# Patient Record
Sex: Female | Born: 1990 | Race: White | Hispanic: No | Marital: Single | Smoking: Former smoker
Health system: Southern US, Community
[De-identification: ages and names within clinical notes are randomized; demographics above are authoritative.]

## PROBLEM LIST (undated history)

## (undated) ENCOUNTER — Ambulatory Visit (HOSPITAL_COMMUNITY): Admission: EM | Payer: Self-pay | Attending: Family Medicine | Admitting: Family Medicine

## (undated) ENCOUNTER — Ambulatory Visit (HOSPITAL_COMMUNITY): Admission: EM | Payer: Self-pay

## (undated) HISTORY — PX: MOUTH SURGERY: SHX715

## (undated) HISTORY — PX: NO PAST SURGERIES: SHX2092

---
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
## 2010-05-26 LAB — GC/CHLAMYDIA PROBE AMP, GENITAL
Chlamydia, DNA Probe: NEGATIVE
GC Probe Amp, Genital: NEGATIVE

## 2010-05-26 LAB — URINALYSIS, ROUTINE W REFLEX MICROSCOPIC
Bilirubin Urine: NEGATIVE
Ketones, ur: 15 mg/dL — AB
Nitrite: NEGATIVE
Specific Gravity, Urine: 1.01 (ref 1.005–1.030)
Urobilinogen, UA: 0.2 mg/dL (ref 0.0–1.0)

## 2010-05-26 LAB — POCT URINALYSIS DIP (DEVICE)
Bilirubin Urine: NEGATIVE
Hgb urine dipstick: NEGATIVE
Nitrite: NEGATIVE
Protein, ur: NEGATIVE mg/dL
Urobilinogen, UA: 0.2 mg/dL (ref 0.0–1.0)
pH: 7 (ref 5.0–8.0)

## 2010-05-26 LAB — URINE CULTURE

## 2010-05-26 LAB — CBC
HCT: 36.5 % (ref 36.0–46.0)
Hemoglobin: 12.3 g/dL (ref 12.0–15.0)
RBC: 3.89 MIL/uL (ref 3.87–5.11)
RDW: 12.4 % (ref 11.5–15.5)
WBC: 11 10*3/uL — ABNORMAL HIGH (ref 4.0–10.5)

## 2010-05-26 LAB — WET PREP, GENITAL: Clue Cells Wet Prep HPF POC: NONE SEEN

## 2010-05-26 LAB — URINE MICROSCOPIC-ADD ON

## 2014-02-03 LAB — OB RESULTS CONSOLE ABO/RH: RH TYPE: POSITIVE

## 2014-02-03 LAB — OB RESULTS CONSOLE GC/CHLAMYDIA
Chlamydia: NEGATIVE
GC PROBE AMP, GENITAL: NEGATIVE

## 2014-02-03 LAB — OB RESULTS CONSOLE ANTIBODY SCREEN: ANTIBODY SCREEN: NEGATIVE

## 2014-02-03 LAB — OB RESULTS CONSOLE HIV ANTIBODY (ROUTINE TESTING): HIV: NONREACTIVE

## 2014-02-03 LAB — OB RESULTS CONSOLE HEPATITIS B SURFACE ANTIGEN: Hepatitis B Surface Ag: NEGATIVE

## 2014-02-03 LAB — OB RESULTS CONSOLE RUBELLA ANTIBODY, IGM: Rubella: IMMUNE

## 2014-02-03 LAB — OB RESULTS CONSOLE RPR: RPR: NONREACTIVE

## 2014-02-19 NOTE — L&D Delivery Note (Signed)
Delivery Note At 7:50 PM a viable and healthy female was delivered via Vaginal, Spontaneous Delivery (Presentation: ; Occiput Anterior).  APGAR: 7,9 ; weight  P.   Placenta status: Intact, Spontaneous.  Cord: 3 vessels with the following complications: None.  Delayed cord clamping.    Anesthesia:   Episiotomy: None Lacerations: 1st degree;Labial;Vaginal Suture Repair: 3.0 vicryl rapide Est. Blood Loss (mL): 490cc  Mom to postpartum.  Baby to Couplet care / Skin to Skin.  [name], Emily Massar 08/13/2014, 8:46 PM  O+/Br/Contra?/RI/Tdap in [facility]

## 2014-07-14 LAB — OB RESULTS CONSOLE GBS: STREP GROUP B AG: POSITIVE

## 2014-08-06 ENCOUNTER — Telehealth (HOSPITAL_COMMUNITY): Payer: Self-pay | Admitting: *Deleted

## 2014-08-06 ENCOUNTER — Encounter (HOSPITAL_COMMUNITY): Payer: Self-pay | Admitting: *Deleted

## 2014-08-06 NOTE — Telephone Encounter (Signed)
Preadmission screen  

## 2014-08-13 ENCOUNTER — Inpatient Hospital Stay (HOSPITAL_COMMUNITY)
Admission: RE | Admit: 2014-08-13 | Discharge: 2014-08-15 | DRG: 775 | Disposition: A | Discharge status: Home / Self Care | Payer: Medicaid Other | Source: Ambulatory Visit | Attending: Obstetrics and Gynecology | Admitting: Obstetrics and Gynecology

## 2014-08-13 ENCOUNTER — Encounter (HOSPITAL_COMMUNITY): Payer: Self-pay

## 2014-08-13 DIAGNOSIS — Z3A39 39 weeks gestation of pregnancy: Secondary | ICD-10-CM | POA: Diagnosis present

## 2014-08-13 DIAGNOSIS — O99824 Streptococcus B carrier state complicating childbirth: Secondary | ICD-10-CM | POA: Diagnosis present

## 2014-08-13 DIAGNOSIS — Z3480 Encounter for supervision of other normal pregnancy, unspecified trimester: Secondary | ICD-10-CM

## 2014-08-13 DIAGNOSIS — Z3483 Encounter for supervision of other normal pregnancy, third trimester: Secondary | ICD-10-CM | POA: Diagnosis present

## 2014-08-13 LAB — COMPREHENSIVE METABOLIC PANEL
ALT: 12 U/L — AB (ref 14–54)
AST: 22 U/L (ref 15–41)
Albumin: 2.9 g/dL — ABNORMAL LOW (ref 3.5–5.0)
Alkaline Phosphatase: 141 U/L — ABNORMAL HIGH (ref 38–126)
Anion gap: 7 (ref 5–15)
BILIRUBIN TOTAL: 0.5 mg/dL (ref 0.3–1.2)
BUN: 7 mg/dL (ref 6–20)
CO2: 21 mmol/L — ABNORMAL LOW (ref 22–32)
Calcium: 8.6 mg/dL — ABNORMAL LOW (ref 8.9–10.3)
Chloride: 107 mmol/L (ref 101–111)
Creatinine, Ser: 0.42 mg/dL — ABNORMAL LOW (ref 0.44–1.00)
GLUCOSE: 105 mg/dL — AB (ref 65–99)
Potassium: 4 mmol/L (ref 3.5–5.1)
SODIUM: 135 mmol/L (ref 135–145)
Total Protein: 6.3 g/dL — ABNORMAL LOW (ref 6.5–8.1)

## 2014-08-13 LAB — CBC
HCT: 31.1 % — ABNORMAL LOW (ref 36.0–46.0)
Hemoglobin: 10.3 g/dL — ABNORMAL LOW (ref 12.0–15.0)
MCH: 28.1 pg (ref 26.0–34.0)
MCHC: 33.1 g/dL (ref 30.0–36.0)
MCV: 85 fL (ref 78.0–100.0)
PLATELETS: 318 10*3/uL (ref 150–400)
RBC: 3.66 MIL/uL — ABNORMAL LOW (ref 3.87–5.11)
RDW: 14.5 % (ref 11.5–15.5)
WBC: 9.5 10*3/uL (ref 4.0–10.5)

## 2014-08-13 LAB — TYPE AND SCREEN
ABO/RH(D): O POS
Antibody Screen: NEGATIVE

## 2014-08-13 LAB — ABO/RH: ABO/RH(D): O POS

## 2014-08-13 MED ORDER — PENICILLIN G POTASSIUM 5000000 UNITS IJ SOLR
2.5000 10*6.[IU] | INTRAVENOUS | Status: DC
  Administered 2014-08-13 (×2): 2.5 10*6.[IU] via INTRAVENOUS
  Filled 2014-08-13 (×5): qty 2.5

## 2014-08-13 MED ORDER — FENTANYL 2.5 MCG/ML BUPIVACAINE 1/10 % EPIDURAL INFUSION (WH - ANES): 14.0000 mL/h | INTRAMUSCULAR | Status: DC | PRN

## 2014-08-13 MED ORDER — ZOLPIDEM TARTRATE 5 MG PO TABS: 5.0000 mg | ORAL_TABLET | Freq: Every evening | ORAL | Status: DC | PRN

## 2014-08-13 MED ORDER — LACTATED RINGERS IV SOLN: INTRAVENOUS | Status: DC

## 2014-08-13 MED ORDER — PENICILLIN G POTASSIUM 5000000 UNITS IJ SOLR
5.0000 10*6.[IU] | Freq: Once | INTRAVENOUS | Status: AC
  Administered 2014-08-13: 5 10*6.[IU] via INTRAVENOUS
  Filled 2014-08-13: qty 5

## 2014-08-13 MED ORDER — ACETAMINOPHEN 325 MG PO TABS: 650.0000 mg | ORAL_TABLET | ORAL | Status: DC | PRN

## 2014-08-13 MED ORDER — OXYCODONE-ACETAMINOPHEN 5-325 MG PO TABS: 1.0000 | ORAL_TABLET | ORAL | Status: DC | PRN

## 2014-08-13 MED ORDER — LACTATED RINGERS IV SOLN: 500.0000 mL | INTRAVENOUS | Status: DC | PRN

## 2014-08-13 MED ORDER — OXYTOCIN 40 UNITS IN LACTATED RINGERS INFUSION - SIMPLE MED
1.0000 m[IU]/min | INTRAVENOUS | Status: DC
  Administered 2014-08-13: 2 m[IU]/min via INTRAVENOUS
  Filled 2014-08-13: qty 1000

## 2014-08-13 MED ORDER — BENZOCAINE-MENTHOL 20-0.5 % EX AERO
1.0000 "application " | INHALATION_SPRAY | CUTANEOUS | Status: DC | PRN
  Administered 2014-08-14 – 2014-08-15 (×2): 1 via TOPICAL
  Filled 2014-08-13 (×2): qty 56

## 2014-08-13 MED ORDER — DIBUCAINE 1 % RE OINT: 1.0000 "application " | TOPICAL_OINTMENT | RECTAL | Status: DC | PRN

## 2014-08-13 MED ORDER — ONDANSETRON HCL 4 MG PO TABS: 4.0000 mg | ORAL_TABLET | ORAL | Status: DC | PRN

## 2014-08-13 MED ORDER — SIMETHICONE 80 MG PO CHEW: 80.0000 mg | CHEWABLE_TABLET | ORAL | Status: DC | PRN

## 2014-08-13 MED ORDER — PRENATAL MULTIVITAMIN CH
1.0000 | ORAL_TABLET | Freq: Every day | ORAL | Status: DC
  Administered 2014-08-14 – 2014-08-15 (×2): 1 via ORAL
  Filled 2014-08-13 (×2): qty 1

## 2014-08-13 MED ORDER — OXYTOCIN BOLUS FROM INFUSION
500.0000 mL | INTRAVENOUS | Status: DC
Start: 2014-08-13 — End: 2014-08-13
  Administered 2014-08-13: 500 mL via INTRAVENOUS

## 2014-08-13 MED ORDER — DIPHENHYDRAMINE HCL 50 MG/ML IJ SOLN
12.5000 mg | INTRAMUSCULAR | Status: DC | PRN
Start: 2014-08-13 — End: 2014-08-13

## 2014-08-13 MED ORDER — TERBUTALINE SULFATE 1 MG/ML IJ SOLN
0.2500 mg | Freq: Once | INTRAMUSCULAR | Status: DC | PRN
  Filled 2014-08-13: qty 1

## 2014-08-13 MED ORDER — PHENYLEPHRINE 40 MCG/ML (10ML) SYRINGE FOR IV PUSH (FOR BLOOD PRESSURE SUPPORT)
80.0000 ug | PREFILLED_SYRINGE | INTRAVENOUS | Status: DC | PRN
  Filled 2014-08-13: qty 2

## 2014-08-13 MED ORDER — SENNOSIDES-DOCUSATE SODIUM 8.6-50 MG PO TABS
2.0000 | ORAL_TABLET | ORAL | Status: DC
  Administered 2014-08-14 (×2): 2 via ORAL
  Filled 2014-08-13 (×2): qty 2

## 2014-08-13 MED ORDER — OXYTOCIN 40 UNITS IN LACTATED RINGERS INFUSION - SIMPLE MED: 62.5000 mL/h | INTRAVENOUS | Status: DC

## 2014-08-13 MED ORDER — LIDOCAINE HCL (PF) 1 % IJ SOLN
30.0000 mL | INTRAMUSCULAR | Status: DC | PRN
  Administered 2014-08-13: 30 mL via SUBCUTANEOUS
  Filled 2014-08-13 (×2): qty 30

## 2014-08-13 MED ORDER — EPHEDRINE 5 MG/ML INJ
10.0000 mg | INTRAVENOUS | Status: DC | PRN
  Filled 2014-08-13: qty 2

## 2014-08-13 MED ORDER — WITCH HAZEL-GLYCERIN EX PADS: 1.0000 "application " | MEDICATED_PAD | CUTANEOUS | Status: DC | PRN

## 2014-08-13 MED ORDER — IBUPROFEN 600 MG PO TABS
600.0000 mg | ORAL_TABLET | Freq: Four times a day (QID) | ORAL | Status: DC
  Administered 2014-08-14 – 2014-08-15 (×6): 600 mg via ORAL
  Filled 2014-08-13 (×6): qty 1

## 2014-08-13 MED ORDER — BUTORPHANOL TARTRATE 1 MG/ML IJ SOLN
1.0000 mg | INTRAMUSCULAR | Status: DC | PRN
  Administered 2014-08-13 (×2): 1 mg via INTRAVENOUS
  Filled 2014-08-13 (×2): qty 1

## 2014-08-13 MED ORDER — ONDANSETRON HCL 4 MG/2ML IJ SOLN: 4.0000 mg | Freq: Four times a day (QID) | INTRAMUSCULAR | Status: DC | PRN

## 2014-08-13 MED ORDER — ONDANSETRON HCL 4 MG/2ML IJ SOLN: 4.0000 mg | INTRAMUSCULAR | Status: DC | PRN

## 2014-08-13 MED ORDER — OXYCODONE-ACETAMINOPHEN 5-325 MG PO TABS: 2.0000 | ORAL_TABLET | ORAL | Status: DC | PRN

## 2014-08-13 MED ORDER — DIPHENHYDRAMINE HCL 25 MG PO CAPS
25.0000 mg | ORAL_CAPSULE | Freq: Four times a day (QID) | ORAL | Status: DC | PRN
Start: 2014-08-13 — End: 2014-08-15

## 2014-08-13 MED ORDER — LANOLIN HYDROUS EX OINT: TOPICAL_OINTMENT | CUTANEOUS | Status: DC | PRN

## 2014-08-13 MED ORDER — CITRIC ACID-SODIUM CITRATE 334-500 MG/5ML PO SOLN: 30.0000 mL | ORAL | Status: DC | PRN

## 2014-08-13 NOTE — Progress Notes (Signed)
Patient ID: Amanda Lindsey, female   DOB: 07/06/1990, 24 y.o.   MRN: [id number]  Getting uncomfortable  AFVSS BP mildly elevated, worse with stress gen NAD FHTs 140, good var, category 1 toco q  SVE 3.4/90/0  Cont IOL

## 2014-08-13 NOTE — Progress Notes (Signed)
Patient ID: Amanda Lindsey, female   DOB: October 13, 1990, 24 y.o.   MRN: [id number]  No c/o's, +FM, no LOF, no VB, occ ctx.  D/w pt IOL  AFVSS, BP mildly elevated gen NAD FHTs 130's, good var, category 1 toco q  SVE 2/80/-1, AROM for clear fluid, w/o diff/comp  Cont IOL

## 2014-08-13 NOTE — H&P (Signed)
Amanda Lindsey is a 24 y.o. female G2P0010 at 39+ for IOL, some elevated BP.  Prenatal care largely uncomplicated.  +FM, no LOF, no VB, occ ctx. D/W pt r/b/a of IOL.  Will closely monitor BP.  Also GBBS + - will prophylax.      Maternal Medical History:  Contractions: Frequency: irregular.   Perceived severity is moderate.    Fetal activity: Perceived fetal activity is normal.    Prenatal Complications - Diabetes: none.    OB History    Gravida Para Term Preterm AB TAB SAB Ectopic Multiple Living   2    1         G1 TAB G2 present No abn pap No STD History reviewed. No pertinent past medical history. History reviewed. No pertinent past surgical history. Family History: family history includes Polycythemia in her paternal grandfather.CAD, HTN Social History:  reports that she quit smoking about 6 months ago. She has never used smokeless tobacco. Her alcohol and drug histories are not on file.  H/o MJ use.  Pharm tech, single Meds PNV, TUMS All NKDA   Prenatal Transfer Tool  Maternal Diabetes: No Genetic Screening: Declined Maternal Ultrasounds/Referrals: Normal Fetal Ultrasounds or other Referrals:  None Maternal Substance Abuse:  Yes:  Type: Other: h/o MJ Significant Maternal Medications:  None Significant Maternal Lab Results:  Lab values include: Group B Strep positive Other Comments:  None  Review of Systems  Constitutional: Negative.   HENT: Negative.   Eyes: Negative.   Respiratory: Negative.   Cardiovascular: Negative.   Gastrointestinal: Negative.   Genitourinary: Negative.   Musculoskeletal: Negative.   Skin: Negative.   Neurological: Negative.   Psychiatric/Behavioral: Negative.     Dilation: 2 Effacement (%): 70 Station: -2 Exam by:: J.Thornton, RN Blood pressure 144/88, pulse 88, temperature 97.9 F (36.6 C), temperature source Oral, resp. rate 18, height 5\' 5"  (1.651 m), weight 102.967 kg (227 lb), last menstrual period 11/08/2013. Maternal Exam:   Abdomen: Patient reports no abdominal tenderness. Fundal height is appropriate for gestation.   Estimated fetal weight is 7-8#.   Fetal presentation: vertex  Introitus: Normal vulva. Normal vagina.  Cervix: Cervix evaluated by digital exam.     Physical Exam  Constitutional: She is oriented to person, place, and time. She appears well-developed and well-nourished.  HENT:  Head: Normocephalic and atraumatic.  Cardiovascular: Normal rate and regular rhythm.   Respiratory: Effort normal and breath sounds normal. No respiratory distress. She has no wheezes.  GI: Soft. Bowel sounds are normal. She exhibits no distension. There is no tenderness.  Musculoskeletal: Normal range of motion.  Neurological: She is alert and oriented to person, place, and time.  Skin: Skin is warm and dry.  Psychiatric: She has a normal mood and affect. Her behavior is normal.    Prenatal labs: ABO, Rh: O/Positive/-- (12/16 0000) Antibody: Negative (12/16 0000) Rubella: Immune (12/16 0000) RPR: Nonreactive (12/16 0000)  HBsAg: Negative (12/16 0000)  HIV: Non-reactive (12/16 0000)  GBS: Positive (05/25 0000)   Tdap 05/19/14, Hgb 12.7 - 11.3/Plt 316K/Ur Cx neg/GC neg/ Chl neg/ CF neg/ glucola 104  [place] nl anat, ant plac, female  Assessment/Plan: 23yo G2P0 at 39+ for IOL Elevated BP - watch, treat prn GBBS + - PCN for prophylaxis Epidural prn Pitocin and AROM prn    [name], Numa Schroeter 08/13/2014, 8:11 AM

## 2014-08-13 NOTE — Progress Notes (Signed)
Provider notified of increased BPs--orders to continue to monitor until IV access is established and initiate hypertensive protocol if still needed

## 2014-08-14 LAB — CBC
HEMATOCRIT: 26.8 % — AB (ref 36.0–46.0)
Hemoglobin: 8.9 g/dL — ABNORMAL LOW (ref 12.0–15.0)
MCH: 28.4 pg (ref 26.0–34.0)
MCHC: 33.2 g/dL (ref 30.0–36.0)
MCV: 85.6 fL (ref 78.0–100.0)
Platelets: 349 10*3/uL (ref 150–400)
RBC: 3.13 MIL/uL — ABNORMAL LOW (ref 3.87–5.11)
RDW: 14.9 % (ref 11.5–15.5)
WBC: 20.9 10*3/uL — AB (ref 4.0–10.5)

## 2014-08-14 LAB — RPR: RPR: NONREACTIVE

## 2014-08-14 NOTE — Lactation Note (Signed)
This note was copied from the chart of Amanda Kamden Pooler. Lactation Consultation Note  Patient Name: Amanda Lindsey [id number]'D Date: 08/14/2014 Reason for consult: Initial assessment  Visited with Mom, baby 63 hrs old.  Mom had been given a nipple shield (16 mm) to assist with latching.  Assistance given with trying to latch baby without use of nipple shield.  Baby has small mouth, and high palate.  Mom has short nipple shafts, and compressible areola.  Baby doesn't open wide and Mom starting to latch baby onto nipple only.  Mom had been using nipple shield to push into baby's mouth to open it.  Baby able to latch onto bare breast, but does fall asleep after a few bursts of sucks and swallows.  Pre-pumped, lots of colostrum expressed.  Demonstrated proper nipple shield placement, and baby easily latched.  Encouraged Mom to try to elicit wide open mouth before latching.  Multiple swallows heard.  Encouraged skin to skin, and cue based feedings.  Touched on [facility] use with breast feeding.  Encouraged her to eat well, and drink well, and to avoid any illegal drugs while breast feeding.  Talked about smoking around a baby can increase incidence of SID.  Mom assured me that she would not be smoking anything around baby. Brochure left in room, informed Mom of IP and OP lactation services available. To follow up in am, and to call for assistance prn.  Consult Status Consult Status: Follow-up Date: 08/15/14 Follow-up type: In-patient    Amanda Lindsey 08/14/2014, 2:58 PM

## 2014-08-14 NOTE — Progress Notes (Signed)
Post Partum Day 1 Subjective: no complaints, up ad lib, voiding, tolerating PO and nl lochia, pain controlled.  Some BP elevation, will monitor  Objective: Blood pressure 145/96, pulse 100, temperature 97.5 F (36.4 C), temperature source Oral, resp. rate 18, height 5\' 5"  (1.651 m), weight 102.967 kg (227 lb), last menstrual period 11/08/2013, SpO2 99 %, unknown if currently breastfeeding.  Physical Exam:  General: alert and no distress Lochia: appropriate Uterine Fundus: firm   Recent Labs  08/13/14 0742 08/14/14 0549  HGB 10.3* 8.9*  HCT 31.1* 26.8*    Assessment/Plan: Plan for discharge tomorrow, Breastfeeding and Lactation consult.  Routine care.     LOS: 1 day   [name], Gwendlyn Hanback 08/14/2014, 8:53 AM

## 2014-08-14 NOTE — Progress Notes (Signed)
Spoke with Amanda Lindsey and informed that a Child Protective Investigator will follow up with the family in the community.  

## 2014-08-14 NOTE — Progress Notes (Signed)
CLINICAL SOCIAL WORK MATERNAL/CHILD NOTE  Patient Details  Name: Boy Lesly Bires MRN: [id number] Date of Birth: 08/13/2014  Date: 08/14/2014  Clinical Social Worker Initiating Note: Tenea Sens, LCSWDate/ Time Initiated: 08/14/14/1030   Child's Name: Georgiann Cocker Yott   Legal Guardian:  (Parents Edwena Felty and Nathaniel Man)   Need for Interpreter: None   Date of Referral:     Reason for Referral: Other (Comment)   Referral Source: [facility]   Address: [address] [place], [place] [address]  Phone number:  [phone])   Household Members: Relatives (Maternal grandmother Kamalei Ognibene)   Natural Supports (not living in the home): Extended Family, Immediate Family   Professional Supports:None   Employment: (FOB is employed)   Type of Work:     Education:     Surveyor, quantity Resources:Medicaid   Other Resources: [employer]   Cultural/Religious Considerations Which May Impact Care:none noted  Strengths:   Home is prepared for newborn, family supportive  Risk Factors/Current Problems: Substance Use    Cognitive State: Alert , Able to Concentrate    Mood/Affect: Bright , Happy    CSW Assessment: Acknowledged order for Social Work consult to assess mother's history of marijuana. MOB is a single parent with no other dependents. Parents are not married. Mother states FOB is supportive. She is currently residing with maternal grandmother. She reports occasional use of marijuana during pregnancy for nausea. She admits to a couple of puffs maybe once a week. She denies any hx of dependency. She denies any other illicit drug use or need for treatment. UDS on newborn is positive. Mother became tearful when informed of referral that will be made to DSS. Provided supportive feedback and emotional support. Mother informed of social work Surveyor, mining.   CSW Plan/Description:    Case referred to DSS. Spoke with Wes Early  and informed that he will discuss case with supervisor and decide whether to meet with patient prior to discharge to develop a safety plan.  Lenville Hibberd J, LCSW 08/14/2014, 12:34 PM

## 2014-08-15 MED ORDER — PRENATAL MULTIVITAMIN CH: 1.0000 | ORAL_TABLET | Freq: Every day | ORAL | Status: DC

## 2014-08-15 MED ORDER — IBUPROFEN 800 MG PO TABS: 800.0000 mg | ORAL_TABLET | Freq: Three times a day (TID) | ORAL | Status: DC | PRN

## 2014-08-15 NOTE — Progress Notes (Signed)
Lyberti Chenette self medicated with Ibuprofen from home x 400 mg, per patient at 0530. RN held 0600 dose of Ibuprofen 600mg  for 0600. Re educated patient on medication safety. She placed her Ibuprofen inside her purse at that time.

## 2014-08-15 NOTE — Progress Notes (Addendum)
Post Partum Day 2 Subjective: no complaints, up ad lib, voiding, tolerating PO and nl lochia, pain controlled  Objective: Blood pressure 136/86, pulse 91, temperature 98.2 F (36.8 C), temperature source Oral, resp. rate 18, height 5\' 5"  (1.651 m), weight 102.967 kg (227 lb), last menstrual period 11/08/2013, SpO2 99 %, unknown if currently breastfeeding.  Physical Exam:  General: alert and no distress Lochia: appropriate Uterine Fundus: firm   Recent Labs  08/13/14 0742 08/14/14 0549  HGB 10.3* 8.9*  HCT 31.1* 26.8*    Assessment/Plan: Discharge home, Breastfeeding and Lactation consult.  Discharge home with motrin and PNV, f/u 6 weeks.     LOS: 2 days   [name], Amanda Lindsey 08/15/2014, 8:28 AM

## 2014-08-15 NOTE — Lactation Note (Addendum)
This note was copied from the chart of Amanda Lindsey. Lactation Consultation Note Baby looks very jaundice. Mom stated baby cluster fed during the night. Mom has #16 and #20NS. Mom taught how to apply & clean nipple shield.  Hand expression to show mom colostrum.  Mom's breast concerns me d/t breast tissue to lower end of breast. Has tubular breast w/2 fingers space between breast.  Patient Name: Amanda Pauletta Risch Mom shown how to use DEBP & how to disassemble, clean, & reassemble parts. Mom knows to pump q3h for 15-20 min.Mom reports + breast changes w/pregnancy. States has been leaking for about a month.  Educated about newborn behavior, feeding habits, needing stimulation, and jaundice causes baby to be more sleepy. Mom encouraged to do skin-to-skin. Supplementing formula in NS w/curve tip syring to get baby to start BF. Baby needed lots of stimulation, then BF well. Gave supplementing feeding sheet according to age.  I feel d/t jaundice level rising and sleepy, mom should stay another night for assistance and monitoring of feedings and jaundice levels. Today's Date: 08/15/2014 Reason for consult: Follow-up assessment;Hyperbilirubinemia   Maternal Data    Feeding Feeding Type: Breast Fed  LATCH Score/Interventions Latch: Repeated attempts needed to sustain latch, nipple held in mouth throughout feeding, stimulation needed to elicit sucking reflex. Intervention(s): Skin to skin  Audible Swallowing: A few with stimulation Intervention(s): Skin to skin;Hand expression  Type of Nipple: Everted at rest and after stimulation Intervention(s): Hand pump  Comfort (Breast/Nipple): Soft / non-tender  Problem noted: Mild/Moderate discomfort Interventions (Mild/moderate discomfort): Breast shields;Post-pump  Hold (Positioning): No assistance needed to correctly position infant at breast. Intervention(s): Skin to skin;Position options;Support Pillows;Breastfeeding basics reviewed  LATCH Score:  7  Lactation Tools Discussed/Used Tools: Nipple Shields Nipple shield size: 16;20 Pump Review: Setup, frequency, and cleaning;Milk Storage Initiated by:: RN   Consult Status Consult Status: Follow-up Date: 08/15/14 Follow-up type: In-patient    Charyl Dancer 08/15/2014, 11:28 AM

## 2014-08-15 NOTE — Discharge Summary (Signed)
Obstetric Discharge Summary Reason for Admission: induction of labor Prenatal Procedures: none Intrapartum Procedures: spontaneous vaginal delivery, GBBS prophylaxis Postpartum Procedures: none Complications-Operative and Postpartum: none HEMOGLOBIN  Date Value Ref Range Status  08/14/2014 8.9* 12.0 - 15.0 g/dL Final   HCT  Date Value Ref Range Status  08/14/2014 26.8* 36.0 - 46.0 % Final    Physical Exam:  General: alert and no distress Lochia: appropriate Uterine Fundus: firm  Discharge Diagnoses: Term Pregnancy-delivered  Discharge Information: Date: 08/15/2014 Activity: pelvic rest Diet: routine Medications: PNV and Ibuprofen Condition: stable Instructions: refer to practice specific booklet Discharge to: home Follow-up Information    Follow up with [name], Aizza Santiago, MD. Schedule an appointment as soon as possible for a visit in 6 weeks.   Specialty:  Obstetrics and Gynecology   Why:  for postpartum check   Contact information:   [address] [place] [address] [phone]       Newborn Data: Live born female  Birth Weight: 6 lb 7.9 oz (2946 g) APGAR: 7, 9  Home with mother.  [name], Nareg Breighner 08/15/2014, 10:09 AM

## 2014-08-16 ENCOUNTER — Inpatient Hospital Stay (HOSPITAL_COMMUNITY): Admission: AD | Admit: 2014-08-16 | Payer: Self-pay | Source: Ambulatory Visit | Admitting: Obstetrics and Gynecology

## 2014-08-17 ENCOUNTER — Ambulatory Visit: Payer: Self-pay

## 2014-08-17 NOTE — Lactation Note (Signed)
This note was copied from the chart of Amanda Nathaniel ManCarrie Messing. Lactation Consultation Note  Patient Name: Amanda Lindsey FAOZH'YToday's Date: 08/17/2014 Reason for consult:  ([facility] )   Maternal Data    Feeding    LATCH Score/Interventions                      Lactation Tools Discussed/Used     Consult Status      Kathrin Greathouseorio, Cortney Beissel Ann 08/17/2014, 1:06 PM

## 2014-11-17 ENCOUNTER — Telehealth (HOSPITAL_COMMUNITY): Payer: Self-pay | Admitting: Emergency Medicine

## 2014-11-17 ENCOUNTER — Emergency Department (INDEPENDENT_AMBULATORY_CARE_PROVIDER_SITE_OTHER)
Admission: EM | Admit: 2014-11-17 | Discharge: 2014-11-17 | Disposition: A | Discharge status: Home / Self Care | Payer: Self-pay | Source: Home / Self Care | Attending: Family Medicine | Admitting: Family Medicine

## 2014-11-17 ENCOUNTER — Encounter (HOSPITAL_COMMUNITY): Payer: Self-pay | Admitting: *Deleted

## 2014-11-17 DIAGNOSIS — S0502XA Injury of conjunctiva and corneal abrasion without foreign body, left eye, initial encounter: Secondary | ICD-10-CM

## 2014-11-17 MED ORDER — TETRACAINE HCL 0.5 % OP SOLN
OPHTHALMIC | Status: AC
  Filled 2014-11-17: qty 2

## 2014-11-17 MED ORDER — TOBRAMYCIN 0.3 % OP OINT: 1.0000 "application " | TOPICAL_OINTMENT | Freq: Three times a day (TID) | OPHTHALMIC | Status: DC

## 2014-11-17 NOTE — Discharge Instructions (Signed)
See eye doctor for recheck.

## 2014-11-17 NOTE — ED Notes (Signed)
Accessed for patient request per pharmacy

## 2014-11-17 NOTE — ED Provider Notes (Signed)
CSN: [id number]     Arrival date & time 11/17/14  1618 History   First MD Initiated Contact with Patient 11/17/14 1748     Chief Complaint  Patient presents with  . Eye Problem   (Consider location/radiation/quality/duration/timing/severity/associated sxs/prior Treatment) Patient is a 24 y.o. female presenting with eye problem. The history is provided by the patient.  Eye Problem Location:  L eye Quality:  Burning Severity:  Mild Onset quality:  Sudden Progression:  Unchanged Chronicity:  Recurrent Context comment:  Eye injury 2mo ago , resolved but spont sx yest on awakening. no contact lenses. Relieved by:  None tried Worsened by:  Nothing tried Ineffective treatments:  None tried Associated symptoms: blurred vision, photophobia, redness and tearing     Past Medical History  Diagnosis Date  . SVD (spontaneous vaginal delivery) 08/13/2014   History reviewed. No pertinent past surgical history. Family History  Problem Relation Age of Onset  . Polycythemia Paternal Grandfather    Social History  Substance Use Topics  . Smoking status: Former Smoker    Quit date: 02/11/2014  . Smokeless tobacco: Never Used  . Alcohol Use: None   OB History    Gravida Para Term Preterm AB TAB SAB Ectopic Multiple Living   0 1     Review of Systems  Constitutional: Negative.   HENT: Negative.   Eyes: Positive for blurred vision, photophobia, pain and redness.  All other systems reviewed and are negative.   Allergies  Review of patient's allergies indicates no known allergies.  Home Medications   Prior to Admission medications   Medication Sig Start Date End Date Taking? Authorizing Provider  calcium carbonate (TUMS - DOSED IN MG ELEMENTAL CALCIUM) 500 MG chewable tablet Chew 1 tablet by mouth daily as needed for indigestion or heartburn.    Historical Provider, MD  ibuprofen (ADVIL,MOTRIN) 800 MG tablet Take 1 tablet (800 mg total) by mouth every 8 (eight) hours as  needed for moderate pain. 08/15/14   Sherian Rein, MD  Prenatal Vit-Fe Fumarate-FA (PRENATAL MULTIVITAMIN) TABS tablet Take 1 tablet by mouth daily at 12 noon. 08/15/14   Jody [name], MD  tobramycin (TOBREX) 0.3 % ophthalmic ointment Place 1 application into the left eye 3 (three) times daily. 11/17/14   Linna Hoff, MD   Meds Ordered and Administered this Visit  Medications - No data to display  BP 144/91 mmHg  Pulse 87  Temp(Src) 98 F (36.7 C) (Oral)  Resp 18  SpO2 98%  LMP 11/06/2014 No data found.   Physical Exam  Constitutional: She is oriented to person, place, and time. She appears well-developed and well-nourished. She appears distressed.  HENT:  Right Ear: External ear normal.  Left Ear: External ear normal.  Mouth/Throat: Oropharynx is clear and moist.  Eyes: EOM are normal. Pupils are equal, round, and reactive to light. Lids are everted and swept, no foreign bodies found. Left eye exhibits no discharge. Left conjunctiva is injected.    Neck: Normal range of motion. Neck supple.  Lymphadenopathy:    She has no cervical adenopathy.  Neurological: She is alert and oriented to person, place, and time.  Skin: Skin is warm and dry.  Nursing note and vitals reviewed.   ED Course  Procedures (including critical care time)  Labs Review Labs Reviewed - No data to display  Imaging Review No results found.   Visual Acuity Review  Right Eye Distance: 20/100 Left Eye Distance: 20/100  Bilateral Distance: 20/70  Right Eye Near:   Left Eye Near:    Bilateral Near:         MDM   1. Corneal abrasion, left, initial encounter        Linna Hoff, MD 11/17/14 [phone]

## 2014-11-17 NOTE — ED Notes (Signed)
Pt  Reports       l   Eye  Irritated    Since  Yesterday              Pt       Reports         Symptoms      Not  releived   By otc meds

## 2015-09-14 LAB — OB RESULTS CONSOLE GC/CHLAMYDIA
Chlamydia: NEGATIVE
GC PROBE AMP, GENITAL: NEGATIVE

## 2015-09-14 LAB — OB RESULTS CONSOLE HIV ANTIBODY (ROUTINE TESTING): HIV: NONREACTIVE

## 2015-09-14 LAB — OB RESULTS CONSOLE RPR: RPR: NONREACTIVE

## 2016-02-20 NOTE — L&D Delivery Note (Addendum)
Delivery Note Pt with bradycardia 60-90's, but rapid change. Anterior lip reduced and pt delivered with 3 contractions.   At 7:44 PM a viable and healthy female was delivered via Vaginal, Spontaneous Delivery (Presentation: OA; LOT  ).  APGAR: 8,9 ; weight P .   Placenta status: delivered, intact .  Cord: 3V  with the following complications: nuchal x 1 .    Anesthesia:  stadol Episiotomy: None Lacerations: None Suture Repair: N/A Est. Blood Loss (mL):  300cc  Mom to postpartum.  Baby to Couplet care / Skin to Skin.  [name], Naraya Stoneberg 04/01/2016, 8:01 PM  Br/RI/Tdap declined in PNC/Contra BTL when papers mature/O+  Pt declines circ

## 2016-03-14 LAB — OB RESULTS CONSOLE GBS: STREP GROUP B AG: NEGATIVE

## 2016-04-01 ENCOUNTER — Encounter (HOSPITAL_COMMUNITY): Payer: Self-pay

## 2016-04-01 ENCOUNTER — Inpatient Hospital Stay (HOSPITAL_COMMUNITY)
Admission: AD | Admit: 2016-04-01 | Discharge: 2016-04-03 | DRG: 775 | Disposition: A | Discharge status: Home / Self Care | Payer: Medicaid Other | Source: Ambulatory Visit | Attending: Obstetrics and Gynecology | Admitting: Obstetrics and Gynecology

## 2016-04-01 DIAGNOSIS — Z8249 Family history of ischemic heart disease and other diseases of the circulatory system: Secondary | ICD-10-CM | POA: Diagnosis not present

## 2016-04-01 DIAGNOSIS — Z3A38 38 weeks gestation of pregnancy: Secondary | ICD-10-CM | POA: Diagnosis not present

## 2016-04-01 DIAGNOSIS — Z87891 Personal history of nicotine dependence: Secondary | ICD-10-CM | POA: Diagnosis not present

## 2016-04-01 DIAGNOSIS — R030 Elevated blood-pressure reading, without diagnosis of hypertension: Secondary | ICD-10-CM | POA: Diagnosis present

## 2016-04-01 LAB — COMPREHENSIVE METABOLIC PANEL
ALBUMIN: 3 g/dL — AB (ref 3.5–5.0)
ALT: 13 U/L — ABNORMAL LOW (ref 14–54)
ANION GAP: 9 (ref 5–15)
AST: 16 U/L (ref 15–41)
Alkaline Phosphatase: 249 U/L — ABNORMAL HIGH (ref 38–126)
BUN: 7 mg/dL (ref 6–20)
CO2: 23 mmol/L (ref 22–32)
Calcium: 9 mg/dL (ref 8.9–10.3)
Chloride: 105 mmol/L (ref 101–111)
Creatinine, Ser: 0.31 mg/dL — ABNORMAL LOW (ref 0.44–1.00)
GFR calc Af Amer: >60 mL/min (ref >60)
GFR calc non Af Amer: >60 mL/min (ref >60)
GLUCOSE: 90 mg/dL (ref 65–99)
Potassium: 3.8 mmol/L (ref 3.5–5.1)
Sodium: 137 mmol/L (ref 135–145)
TOTAL PROTEIN: 7 g/dL (ref 6.5–8.1)
Total Bilirubin: 0.1 mg/dL — ABNORMAL LOW (ref 0.3–1.2)

## 2016-04-01 LAB — TYPE AND SCREEN
ABO/RH(D): O POS
ANTIBODY SCREEN: NEGATIVE

## 2016-04-01 LAB — CBC
HCT: 32.8 % — ABNORMAL LOW (ref 36.0–46.0)
Hemoglobin: 11.1 g/dL — ABNORMAL LOW (ref 12.0–15.0)
MCH: 28.6 pg (ref 26.0–34.0)
MCHC: 33.8 g/dL (ref 30.0–36.0)
MCV: 84.5 fL (ref 78.0–100.0)
Platelets: 374 10*3/uL (ref 150–400)
RBC: 3.88 MIL/uL (ref 3.87–5.11)
RDW: 14.6 % (ref 11.5–15.5)
WBC: 11 10*3/uL — ABNORMAL HIGH (ref 4.0–10.5)

## 2016-04-01 LAB — PROTEIN / CREATININE RATIO, URINE
Creatinine, Urine: 107 mg/dL
PROTEIN CREATININE RATIO: 0.18 mg/mg{creat} — AB (ref 0.00–0.15)
Total Protein, Urine: 19 mg/dL

## 2016-04-01 LAB — AMNISURE RUPTURE OF MEMBRANE (ROM) NOT AT ARMC: AMNISURE: NEGATIVE

## 2016-04-01 MED ORDER — DIBUCAINE 1 % RE OINT: 1.0000 "application " | TOPICAL_OINTMENT | RECTAL | Status: DC | PRN

## 2016-04-01 MED ORDER — LACTATED RINGERS IV SOLN: 500.0000 mL | INTRAVENOUS | Status: DC | PRN

## 2016-04-01 MED ORDER — FLEET ENEMA 7-19 GM/118ML RE ENEM: 1.0000 | ENEMA | RECTAL | Status: DC | PRN

## 2016-04-01 MED ORDER — COCONUT OIL OIL
1.0000 "application " | TOPICAL_OIL | Status: DC | PRN
  Administered 2016-04-02: 1 via TOPICAL
  Filled 2016-04-01: qty 120

## 2016-04-01 MED ORDER — OXYTOCIN 10 UNIT/ML IJ SOLN
INTRAMUSCULAR | Status: AC
  Administered 2016-04-01: 10 [IU] via INTRAMUSCULAR
  Filled 2016-04-01: qty 1

## 2016-04-01 MED ORDER — OXYCODONE HCL 5 MG PO TABS
5.0000 mg | ORAL_TABLET | ORAL | Status: DC | PRN
  Administered 2016-04-02 (×3): 5 mg via ORAL
  Filled 2016-04-01 (×4): qty 1

## 2016-04-01 MED ORDER — OXYTOCIN 40 UNITS IN LACTATED RINGERS INFUSION - SIMPLE MED
2.5000 [IU]/h | INTRAVENOUS | Status: DC
  Filled 2016-04-01: qty 1000

## 2016-04-01 MED ORDER — ACETAMINOPHEN 325 MG PO TABS
650.0000 mg | ORAL_TABLET | ORAL | Status: DC | PRN
  Administered 2016-04-02: 650 mg via ORAL
  Filled 2016-04-01: qty 2

## 2016-04-01 MED ORDER — OXYTOCIN 10 UNIT/ML IJ SOLN
10.0000 [IU] | Freq: Once | INTRAMUSCULAR | Status: AC
  Administered 2016-04-01: 10 [IU] via INTRAMUSCULAR

## 2016-04-01 MED ORDER — IBUPROFEN 600 MG PO TABS
600.0000 mg | ORAL_TABLET | Freq: Four times a day (QID) | ORAL | Status: DC
  Administered 2016-04-01 – 2016-04-03 (×6): 600 mg via ORAL
  Filled 2016-04-01 (×6): qty 1

## 2016-04-01 MED ORDER — ZOLPIDEM TARTRATE 5 MG PO TABS
5.0000 mg | ORAL_TABLET | Freq: Every evening | ORAL | Status: DC | PRN
Start: 2016-04-01 — End: 2016-04-03

## 2016-04-01 MED ORDER — BENZOCAINE-MENTHOL 20-0.5 % EX AERO: 1.0000 "application " | INHALATION_SPRAY | CUTANEOUS | Status: DC | PRN

## 2016-04-01 MED ORDER — ACETAMINOPHEN 325 MG PO TABS
650.0000 mg | ORAL_TABLET | ORAL | Status: DC | PRN
Start: 2016-04-01 — End: 2016-04-01

## 2016-04-01 MED ORDER — BUTORPHANOL TARTRATE 1 MG/ML IJ SOLN
1.0000 mg | INTRAMUSCULAR | Status: DC | PRN
  Administered 2016-04-01: 1 mg via INTRAVENOUS
  Filled 2016-04-01: qty 1

## 2016-04-01 MED ORDER — SIMETHICONE 80 MG PO CHEW: 80.0000 mg | CHEWABLE_TABLET | ORAL | Status: DC | PRN

## 2016-04-01 MED ORDER — DIPHENHYDRAMINE HCL 25 MG PO CAPS: 25.0000 mg | ORAL_CAPSULE | Freq: Four times a day (QID) | ORAL | Status: DC | PRN

## 2016-04-01 MED ORDER — TETANUS-DIPHTH-ACELL PERTUSSIS 5-2.5-18.5 LF-MCG/0.5 IM SUSP: 0.5000 mL | Freq: Once | INTRAMUSCULAR | Status: DC

## 2016-04-01 MED ORDER — OXYCODONE-ACETAMINOPHEN 5-325 MG PO TABS: 2.0000 | ORAL_TABLET | ORAL | Status: DC | PRN

## 2016-04-01 MED ORDER — NALOXONE HCL 0.4 MG/ML IJ SOLN
INTRAMUSCULAR | Status: AC
  Filled 2016-04-01: qty 1

## 2016-04-01 MED ORDER — OXYCODONE-ACETAMINOPHEN 5-325 MG PO TABS: 1.0000 | ORAL_TABLET | ORAL | Status: DC | PRN

## 2016-04-01 MED ORDER — WITCH HAZEL-GLYCERIN EX PADS: 1.0000 "application " | MEDICATED_PAD | CUTANEOUS | Status: DC | PRN

## 2016-04-01 MED ORDER — ONDANSETRON HCL 4 MG/2ML IJ SOLN: 4.0000 mg | Freq: Four times a day (QID) | INTRAMUSCULAR | Status: DC | PRN

## 2016-04-01 MED ORDER — ONDANSETRON HCL 4 MG/2ML IJ SOLN: 4.0000 mg | INTRAMUSCULAR | Status: DC | PRN

## 2016-04-01 MED ORDER — PRENATAL MULTIVITAMIN CH
1.0000 | ORAL_TABLET | Freq: Every day | ORAL | Status: DC
  Administered 2016-04-02: 1 via ORAL
  Filled 2016-04-01: qty 1

## 2016-04-01 MED ORDER — SOD CITRATE-CITRIC ACID 500-334 MG/5ML PO SOLN: 30.0000 mL | ORAL | Status: DC | PRN

## 2016-04-01 MED ORDER — ONDANSETRON HCL 4 MG PO TABS: 4.0000 mg | ORAL_TABLET | ORAL | Status: DC | PRN

## 2016-04-01 MED ORDER — LACTATED RINGERS IV SOLN: INTRAVENOUS | Status: DC

## 2016-04-01 MED ORDER — LIDOCAINE HCL (PF) 1 % IJ SOLN
30.0000 mL | INTRAMUSCULAR | Status: DC | PRN
  Filled 2016-04-01: qty 30

## 2016-04-01 MED ORDER — OXYTOCIN BOLUS FROM INFUSION: 500.0000 mL | Freq: Once | INTRAVENOUS | Status: DC

## 2016-04-01 MED ORDER — OXYCODONE HCL 5 MG PO TABS: 10.0000 mg | ORAL_TABLET | ORAL | Status: DC | PRN

## 2016-04-01 MED ORDER — SENNOSIDES-DOCUSATE SODIUM 8.6-50 MG PO TABS
2.0000 | ORAL_TABLET | ORAL | Status: DC
  Administered 2016-04-01: 2 via ORAL
  Filled 2016-04-01 (×2): qty 2

## 2016-04-01 NOTE — MAU Note (Signed)
Pt presents with complaint of contractions and ? Leaking fluid since 1130 am.

## 2016-04-01 NOTE — H&P (Signed)
Amanda Lindsey is a 26 y.o. female G2P1001 at 26wk with elevated BP and cervical change.  Progressed rapidly to deliver, see separate note.  Pregnancy relatively uncomplicated.  First trimester screen WNL  Pt has h/o tobacco use.  Early US showed abruption.  Pt declined Tdap and Flu.    OB History    Gravida Para Term Preterm AB Living   3 1 1   1 1    SAB TAB Ectopic Multiple Live Births         0 1    G1 SAB G2 6#7 female 6/16 SVD G3 present  No abn pap, no STD  Past Medical History:  Diagnosis Date  . SVD (spontaneous vaginal delivery) 08/13/2014  . SVD (spontaneous vaginal delivery) 04/01/2016   Past Surgical History:  Procedure Laterality Date  . NO PAST SURGERIES     Family History: family history includes Polycythemia in her paternal grandfather. CAD, HTN Social History:  reports that she quit smoking about 2 years ago. She has never used smokeless tobacco. 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   HENT: Negative.   Eyes: Negative.   Respiratory: Negative.   Cardiovascular: Negative.   Gastrointestinal: Positive for abdominal pain.  Genitourinary: Negative.   Musculoskeletal: Positive for back pain.  Skin: Negative.   Neurological: Negative.   Psychiatric/Behavioral: Negative.    Maternal Medical History:  Reason for admission: Contractions.   Contractions: Onset was 3-5 hours ago.   Frequency: regular.   Perceived severity is mild.    Fetal activity: Perceived fetal activity is normal.    Prenatal complications: no prenatal complications Prenatal Complications  - Diabetes: none.    Dilation: 10 Effacement (%): 100 Station: 0 Exam by:: dr Ellyn HackBovard Blood pressure (!) 164/85, pulse 98, temperature 97.7 F (36.5 C), temperature source Oral, resp. rate 18, height 5\' 5"  (1.651 m), weight 95.3 kg (210 lb), last menstrual period 07/09/2015, SpO2 98 %, unknown if currently breastfeeding. Maternal Exam:  Uterine Assessment: Contraction strength is moderate.  Contraction frequency is regular.   Abdomen: Patient reports no abdominal tenderness. Fundal height is approprate for gestation.   Estimated fetal weight is 7#.   Fetal presentation: vertex  Introitus: Normal vulva. Normal vagina.  Pelvis: adequate for delivery.      Physical Exam  Constitutional: She is oriented to person, place, and time. She appears well-developed and well-nourished.  HENT:  Head: Normocephalic and atraumatic.  Cardiovascular: Normal rate and regular rhythm.   Respiratory: Effort normal and breath sounds normal. No respiratory distress. She has no wheezes.  GI: Soft. Bowel sounds are normal. She exhibits no distension. There is no tenderness.  Musculoskeletal: Normal range of motion.  Neurological: She is alert and oriented to person, place, and time.  Skin: Skin is warm and dry.  Psychiatric: She has a normal mood and affect. Her behavior is normal.    Prenatal labs: ABO, Rh: --/--/O POS (02/11 1638) Antibody: NEG (02/11 1638) Rubella:  immune RPR: Nonreactive (07/26 0000)  HBsAg:  negative  HIV: Non-reactive (07/26 0000)  GBS: Negative (01/24 0000)   Hgb 13.1/Plt 363/Ur Cx neg/GC neg/Chl neg/Varicella immune  Nl NT, first trimester screen WNL glucola 116  Nl anat, post plac, female Tdap/Flu declined  Assessment/Plan: 26yo G3P1011 at 52 in labor, presented, rapidly delivered - now [id number] Desires BTL when papers mature   [name], [name] 04/01/2016, 8:11 PM

## 2016-04-01 NOTE — MAU Provider Note (Signed)
History     CSN: [id number]  Arrival date and time: 04/01/16 1518   First Provider Initiated Contact with Patient 04/01/16 1558      No chief complaint on file.  HPI Amanda Lindsey, 26 y.o. [redacted]w[redacted]d  Comes to MAU today with contractions.  Was 3 cm dilated when last checked in the office.  Has noticed a headache yesterday and today.  Contractions have been stronger today.  Reports some leaking of fluid this morning and none since then.  Denies any ankle edema and RUQ pain.  Blood pressure is slightly elevated today on arrival.  Will continue to evaluate.  OB History    Gravida Para Term Preterm AB Living   3 1 1   1 1    SAB TAB Ectopic Multiple Live Births         0 1      Past Medical History:  Diagnosis Date  . SVD (spontaneous vaginal delivery) 08/13/2014    Past Surgical History:  Procedure Laterality Date  . NO PAST SURGERIES      Family History  Problem Relation Age of Onset  . Polycythemia Paternal Grandfather     Social History  Substance Use Topics  . Smoking status: Former Smoker    Quit date: 02/11/2014  . Smokeless tobacco: Never Used  . Alcohol use No    Allergies: No Known Allergies  Prescriptions Prior to Admission  Medication Sig Dispense Refill Last Dose  . Prenatal Vit-Fe Fumarate-FA (PRENATAL MULTIVITAMIN) TABS tablet Take 1 tablet by mouth daily.   03/31/2016 at Unknown time    Review of Systems  Constitutional: Negative for fever.  Genitourinary: Negative for vaginal bleeding.       One episode of leaking of fluid Having contractions  Neurological: Positive for headaches.   Physical Exam   Blood pressure 145/82, pulse 93, temperature 98.7 F (37.1 C), temperature source Oral, resp. rate 18, height 5\' 5"  (1.651 m), weight 210 lb (95.3 kg), last menstrual period 07/09/2015, SpO2 100 %, unknown if currently breastfeeding.  Physical Exam  Nursing note and vitals reviewed. Constitutional: She is oriented to person, place, and time. She  appears well-developed and well-nourished.  HENT:  Head: Normocephalic.  Eyes: EOM are normal.  Neck: Neck supple.  GI: Soft. There is no tenderness.  No RUQ tenderness with palpation  Genitourinary:  Genitourinary Comments: Cervical exam by RN - 3 cm dilated  Musculoskeletal: Normal range of motion.  No ankle edema  Neurological: She is alert and oriented to person, place, and time.  Skin: Skin is warm and dry.  Psychiatric: She has a normal mood and affect.    MAU Course  Procedures Results for orders placed or performed during the hospital encounter of 04/01/16 (from the past 24 hour(s))  CBC     Status: Abnormal   Collection Time: 04/01/16  4:18 PM  Result Value Ref Range   WBC 11.0 (H) 4.0 - 10.5 K/uL   RBC 3.88 3.87 - 5.11 MIL/uL   Hemoglobin 11.1 (L) 12.0 - 15.0 g/dL   HCT 40.9 (L) 81.1 - 91.4 %   MCV 84.5 78.0 - 100.0 fL   MCH 28.6 26.0 - 34.0 pg   MCHC 33.8 30.0 - 36.0 g/dL   RDW 78.2 95.6 - 21.3 %   Platelets 374 150 - 400 K/uL  Comprehensive metabolic panel     Status: Abnormal   Collection Time: 04/01/16  4:18 PM  Result Value Ref Range   Sodium 137 135 -  145 mmol/L   Potassium 3.8 3.5 - 5.1 mmol/L   Chloride 105 101 - 111 mmol/L   CO2 23 22 - 32 mmol/L   Glucose, Bld 90 65 - 99 mg/dL   BUN 7 6 - 20 mg/dL   Creatinine, Ser 1.610.31 (L) 0.44 - 1.00 mg/dL   Calcium 9.0 8.9 - 09.610.3 mg/dL   Total Protein 7.0 6.5 - 8.1 g/dL   Albumin 3.0 (L) 3.5 - 5.0 g/dL   AST 16 15 - 41 U/L   ALT 13 (L) 14 - 54 U/L   Alkaline Phosphatase 249 (H) 38 - 126 U/L   Total Bilirubin 0.1 (L) 0.3 - 1.2 mg/dL   GFR calc non Af Amer >60 >60 mL/min   GFR calc Af Amer >60 >60 mL/min   Anion gap 9 5 - 15  Protein / creatinine ratio, urine     Status: Abnormal   Collection Time: 04/01/16  4:18 PM  Result Value Ref Range   Creatinine, Urine 107.00 mg/dL   Total Protein, Urine 19 mg/dL   Protein Creatinine Ratio 0.18 (H) 0.00 - 0.15 mg/mg[Cre]  Amnisure rupture of membrane (rom)not at  [facility]     Status: None   Collection Time: 04/01/16  4:18 PM  Result Value Ref Range   Amnisure ROM NEGATIVE     MDM 1600 consult with Dr. Hayes LudwigBovard-Stuckey to report client's symptoms and discuss plan of care.  Will do serial BPs and labs to evaluate for preeclampsia  1750  Report given to Dr. Hayes LudwigBovard-Stuckey by phone - reviewed labs, RN checked client and she is now 4 cm and 100% effaced - will admit for labor.  Assessment and Plan  Active labor BP fluctuates but labs do not indicate preeclampsia.  Amanda Lindsey 04/01/2016, 4:04 PM

## 2016-04-01 NOTE — Lactation Note (Signed)
This note was copied from a baby's chart. Lactation Consultation Note  Patient Name: Boy Nathaniel ManCarrie Lawhead RUEAV'WToday's Date: 04/01/2016 Reason for consult: Initial assessment Baby at 1 hr of life. Upon entry baby was coming off the breast and being weighted. Mom reports that baby was "slipping off and not really sucking" on the R breast but "did great" on the L breast. She bf her oldest child for 2wk but he was loosing wt so she started supplementing by 4 wk he was fully formula fed. She would "like to try bf longer" with this baby. Discussed baby behavior, feeding frequency, baby belly size, voids, wt loss, breast changes, and nipple care. She stated she can manually express. Given lactation handouts. Aware of OP services and support group.     Maternal Data Has patient been taught Hand Expression?: Yes Does the patient have breastfeeding experience prior to this delivery?: Yes  Feeding    LATCH Score/Interventions                      Lactation Tools Discussed/Used WIC Program: Yes   Consult Status Consult Status: Follow-up Date: 04/02/16 Follow-up type: In-patient    Rulon Eisenmengerlizabeth E Candance Bohlman 04/01/2016, 8:50 PM

## 2016-04-02 ENCOUNTER — Encounter (HOSPITAL_COMMUNITY): Payer: Self-pay

## 2016-04-02 LAB — CBC
HCT: 28.3 % — ABNORMAL LOW (ref 36.0–46.0)
Hemoglobin: 9.5 g/dL — ABNORMAL LOW (ref 12.0–15.0)
MCH: 28.8 pg (ref 26.0–34.0)
MCHC: 33.6 g/dL (ref 30.0–36.0)
MCV: 85.8 fL (ref 78.0–100.0)
PLATELETS: 335 10*3/uL (ref 150–400)
RBC: 3.3 MIL/uL — AB (ref 3.87–5.11)
RDW: 14.7 % (ref 11.5–15.5)
WBC: 13.8 10*3/uL — AB (ref 4.0–10.5)

## 2016-04-02 LAB — RPR: RPR Ser Ql: NONREACTIVE

## 2016-04-02 NOTE — Lactation Note (Signed)
This note was copied from a baby's chart. Lactation Consultation Note  Mother states she had minimal breast changes during pregnancy and had low milk supply with her 1st son. She states she was using a nipple shield and had more difficulty latching the first child. She did not pump with nipple shield with first child.  She states this child is latching much better. Baby 15 hours old.  Reviewed hand expression bilaterally.  Drops easily expressed. Attempted latching but baby too sleepy after recent feeding. Reviewed positioning. Mom encouraged to feed baby 8-12 times/24 hours and with feeding cues.  Encouraged STS.   Patient Name: Boy Nathaniel ManCarrie Ruotolo ZOXWR'UToday's Date: 04/02/2016 Reason for consult: Follow-up assessment   Maternal Data Has patient been taught Hand Expression?: Yes  Feeding Feeding Type: Breast Fed Length of feed: 15 min  LATCH Score/Interventions Latch: Grasps breast easily, tongue down, lips flanged, rhythmical sucking.  Audible Swallowing: A few with stimulation  Type of Nipple: Everted at rest and after stimulation  Comfort (Breast/Nipple): Soft / non-tender     Hold (Positioning): No assistance needed to correctly position infant at breast.  LATCH Score: 9  Lactation Tools Discussed/Used     Consult Status Consult Status: Follow-up Date: 04/03/16 Follow-up type: In-patient    Dahlia ByesBerkelhammer, [facility] 04/02/2016, 10:47 AM

## 2016-04-02 NOTE — Progress Notes (Signed)
Patient ID: Amanda Lindsey, female   DOB: September 10, 1990, 26 y.o.   MRN: [id number] PPD#1 Pt doing well. Crampy but due for pain meds now. Lochia mild, no fever, chills or headaches. Bonding well with baby- does not desire circ. Has no other complaints. Ambulating well VSS ABD- soft, FF EXT - No edema or homans  Hg- 9.5  A/P: PPD#1 s/p precip svd - stable         Routine pp care         Discharge to home tomorrow

## 2016-04-03 MED ORDER — OXYCODONE HCL 5 MG PO TABS: 5.0000 mg | ORAL_TABLET | ORAL | 0 refills | Status: DC | PRN

## 2016-04-03 MED ORDER — IBUPROFEN 600 MG PO TABS: 600.0000 mg | ORAL_TABLET | Freq: Four times a day (QID) | ORAL | 0 refills | Status: DC

## 2016-04-03 NOTE — Lactation Note (Signed)
This note was copied from a baby's chart. Lactation Consultation Note  Patient Name: Boy Nathaniel ManCarrie Mcree NATFT'DToday's Date: 04/03/2016 Reason for consult: Follow-up assessment   Follow up with mom of 38 hour old infant. Infant with 7 BF, 5 voids and 2 stools in last 24 hours. Infant weight 6 lb 3.5 oz with 4% weight loss. LATCH score 9.   Mom reports she feels BF is going well. She reports she is concerned that she cannot measure what is going in. Infant asleep on mom's chest currently. Discussed that infant with good output and weight at this time. Discussed with mom that if at any time she is worried to express with manual pump and hand expression and feed EBM to infant. Mom was told how to spoon feed. Discussed with mom that if anytime she is worried to [facility] the [facility].   Mom reports her breasts are feeling fuller as of last night and that she is able to hand express colostrum. She reports she is having some nipple tenderness with latch. She is using EBM and coconut oil to nipples and says they feel better. Mom reports she is able to hear swallows while infant at breast. Enc mom to compress.masage breast with feeding to maximize milk supply.   Reviewed I/O and enc mom to keep feeding log and take to ped appt and WIC appt. Engorgement prevention/treatment and breast milk storage and handling reviewed with mom. Manual pump given with instructions for use and cleaning. [facility] and plans to call and make an appointment after d/c. LC Brochure reviewed, reviewed OP Services, BF Support Groups and LC phone #. Enc mom to call with any questions/concerns prn.     Maternal Data Formula Feeding for Exclusion: No Has patient been taught Hand Expression?: Yes Does the patient have breastfeeding experience prior to this delivery?: Yes  Feeding Feeding Type: Breast Fed Length of feed: 15 min  LATCH Score/Interventions                     Lactation Tools  Discussed/Used WIC Program: Yes Pump Review: Setup, frequency, and cleaning;Milk Storage   Consult Status Consult Status: Complete Follow-up type: Call as needed    Ed BlalockSharon S Tram Wrenn 04/03/2016, 10:39 AM

## 2016-04-03 NOTE — Progress Notes (Signed)
Post Partum Day 2 Subjective: no complaints and tolerating PO  Objective: Blood pressure 109/60, pulse 76, temperature 98 F (36.7 C), temperature source Oral, resp. rate 18, height 5\' 5"  (1.651 m), weight 95.3 kg (210 lb), last menstrual period 07/09/2015, SpO2 99 %, unknown if currently breastfeeding.  Physical Exam:  General: alert and cooperative Lochia: appropriate Uterine Fundus: firm   Recent Labs  04/01/16 1618 04/02/16 0605  HGB 11.1* 9.5*  HCT 32.8* 28.3*    Assessment/Plan: Discharge home   LOS: 2 days   Leanna Hamid W 04/03/2016, 9:39 AM

## 2016-04-03 NOTE — Discharge Summary (Signed)
OB Discharge Summary     Patient Name: Amanda Lindsey DOB: Aug 19, 1990 MRN: [id number]  Date of admission: 04/01/2016 Delivering MD: Sherian ReinBOVARD-STUCKERT, JODY   Date of discharge: 04/03/2016  Admitting diagnosis: 38 WKS, CTXS Intrauterine pregnancy: 1951w1d     Secondary diagnosis:  Principal Problem:   SVD (spontaneous vaginal delivery) Active Problems:   Normal labor  Additional problems: none     Discharge diagnosis: Term Pregnancy Delivered                                                                                                Post partum procedures: none  Augmentation: none  Complications: None  Hospital course:  Onset of Labor With Vaginal Delivery     26 y.o. yo [id number] at 6051w1d was admitted in Active Labor on 04/01/2016. Patient had an uncomplicated labor course as follows:  Membrane Rupture Time/Date: 11:30 AM ,04/01/2016   Intrapartum Procedures: Episiotomy: None [1]                                         Lacerations:  None [1]  Patient had a delivery of a Viable infant. 04/01/2016  Information for the patient's newborn:  Sheran FavaCox, Boy Randee [[id number]][[id number]]  Delivery Method: Vaginal, Spontaneous Delivery (Filed from Delivery Summary)    Pateint had an uncomplicated postpartum course.  She is ambulating, tolerating a regular diet, passing flatus, and urinating well. Patient is discharged home in stable condition on 04/03/16.   Physical exam  Vitals:   04/02/16 0530 04/02/16 0900 04/02/16 1727 04/03/16 0551  BP: 130/71 129/89 126/81 109/60  Pulse: 85 96 82 76  Resp: 18 15 18 18   Temp: 97.9 F (36.6 C) 97.6 F (36.4 C) 98 F (36.7 C) 98 F (36.7 C)  TempSrc: Oral Oral Oral Oral  SpO2:  99%    Weight:      Height:       General: alert and cooperative Lochia: appropriate Uterine Fundus: firm  Labs: Lab Results  Component Value Date   WBC 13.8 (H) 04/02/2016   HGB 9.5 (L) 04/02/2016   HCT 28.3 (L) 04/02/2016   MCV 85.8 04/02/2016   PLT 335 04/02/2016    CMP Latest Ref Rng & Units 04/01/2016  Glucose 65 - 99 mg/dL 90  BUN 6 - 20 mg/dL 7  Creatinine 1.300.44 - 8.651.00 mg/dL 7.84(O0.31(L)  Sodium 962135 - 952145 mmol/L 137  Potassium 3.5 - 5.1 mmol/L 3.8  Chloride 101 - 111 mmol/L 105  CO2 22 - 32 mmol/L 23  Calcium 8.9 - 10.3 mg/dL 9.0  Total Protein 6.5 - 8.1 g/dL 7.0  Total Bilirubin 0.3 - 1.2 mg/dL 8.4(X0.1(L)  Alkaline Phos 38 - 126 U/L 249(H)  AST 15 - 41 U/L 16  ALT 14 - 54 U/L 13(L)    Discharge instruction: per After Visit Summary and "Baby and Me Booklet".  After visit meds:  Allergies as of 04/03/2016   No Known Allergies     Medication List  TAKE these medications   ibuprofen 600 MG tablet Commonly known as:  ADVIL,MOTRIN Take 1 tablet (600 mg total) by mouth every 6 (six) hours.   oxyCODONE 5 MG immediate release tablet Commonly known as:  Oxy IR/ROXICODONE Take 1 tablet (5 mg total) by mouth every 4 (four) hours as needed (pain scale 4-7).   prenatal multivitamin Tabs tablet Take 1 tablet by mouth daily.       Diet: routine diet  Activity: Advance as tolerated. Pelvic rest for 6 weeks.   Outpatient follow up:6 weeks Follow up Appt:No future appointments. Follow up Visit:No Follow-up on file.  Postpartum contraception: plans tubal ligation  Newborn Data: Live born female  Birth Weight: 6 lb 7.9 oz (2945 g) APGAR: 8, 9  Baby Feeding: Breast Disposition:home with mother   04/03/2016 Oliver Pila, MD

## 2016-04-27 ENCOUNTER — Encounter (HOSPITAL_COMMUNITY)
Admission: RE | Admit: 2016-04-27 | Discharge: 2016-04-27 | Disposition: A | Discharge status: Home / Self Care | Payer: Medicaid Other | Source: Ambulatory Visit | Attending: Obstetrics and Gynecology | Admitting: Obstetrics and Gynecology

## 2016-04-27 ENCOUNTER — Encounter (HOSPITAL_COMMUNITY): Payer: Self-pay

## 2016-04-27 DIAGNOSIS — Z01812 Encounter for preprocedural laboratory examination: Secondary | ICD-10-CM | POA: Insufficient documentation

## 2016-04-27 LAB — CBC
HEMATOCRIT: 34.8 % — AB (ref 36.0–46.0)
HEMOGLOBIN: 11.1 g/dL — AB (ref 12.0–15.0)
MCH: 27.1 pg (ref 26.0–34.0)
MCHC: 31.9 g/dL (ref 30.0–36.0)
MCV: 85.1 fL (ref 78.0–100.0)
PLATELETS: 353 10*3/uL (ref 150–400)
RBC: 4.09 MIL/uL (ref 3.87–5.11)
RDW: 14.1 % (ref 11.5–15.5)
WBC: 8.6 10*3/uL (ref 4.0–10.5)

## 2016-04-27 NOTE — Patient Instructions (Signed)
Your procedure is scheduled on:  Wednesday, May 09, 2016  [facility] at:  7:00 AM  Pick up the phone at the desk and dial [phone].  Call this number if you have problems the morning of surgery: [phone].  Remember: Do NOT eat food or drink after:  Midnight Tuesday, May 08, 2016  Take these medicines the morning of surgery with a SIP OF WATER:  None  Stop ALL herbal medications at this time  Do NOT smoke the day of surgery.  Do NOT wear jewelry (body piercing), metal hair clips/bobby pins, make-up, or nail polish. Do NOT wear lotions, powders, or perfumes.  You may wear deodorant. Do NOT shave for 48 hours prior to surgery. Do NOT bring valuables to the hospital. Contacts, dentures, or bridgework may not be worn into surgery.  Have a responsible adult drive you home and stay with you for 24 hours after your procedure  Bring a copy of your healthcare power of attorney and living will documents.  **Effective Friday, Jan. 12, 2018, [place] will implement no hospital visitations from children age 26 and younger due to a steady increase in flu activity in our community and hospitals. **

## 2016-05-08 NOTE — H&P (Signed)
Amanda Lindsey is an 26 y.o. female [id number] s/p SVD 04/01/16 for PPBTL by fulguration.  Pt's tubal papers were not signed 30 days prior to delivery - so PP scheduling of BTL.  D/W pt r/b/a of PPBTL including bleeding, infection and damage to surrounding organs, also increased risk of ectopic pregnancy.  D/W pt permanence of procedure.  Pt voiced understanding, questions  answered.    Pertinent Gynecological History: OB History: [id number]: G3P2012 s/p SVD 04/01/16 6#7 TAB, G1 and G2 SVD 6#7 No abn pap, last 05/2015 No STDs  Menstrual History:  No LMP recorded.    Past Medical History:  Diagnosis Date  . SVD (spontaneous vaginal delivery) 08/13/2014  . SVD (spontaneous vaginal delivery) 04/01/2016    Past Surgical History:  Procedure Laterality Date  . MOUTH SURGERY    . NO PAST SURGERIES      Family History  Problem Relation Age of Onset  . Polycythemia Paternal Grandfather   HTN, CAD  Social History:  reports that she quit smoking about 2 years ago. She has never used smokeless tobacco. She reports that she does not drink alcohol or use drugs.single, pharmacy tech  Allergies: No Known Allergies  No prescriptions prior to admission.    Review of Systems  Constitutional: Negative.   HENT: Negative.   Eyes: Negative.   Respiratory: Negative.   Cardiovascular: Negative.   Gastrointestinal: Negative.   Genitourinary: Negative.   Musculoskeletal: Negative.   Skin: Negative.   Neurological: Negative.   Psychiatric/Behavioral: Negative.     unknown if currently breastfeeding. Physical Exam  Constitutional: She is oriented to person, place, and time. She appears well-developed and well-nourished.  HENT:  Head: Normocephalic and atraumatic.  Cardiovascular: Normal rate and regular rhythm.   Respiratory: Effort normal and breath sounds normal. No respiratory distress. She has no wheezes.  GI: Soft. Bowel sounds are normal. She exhibits no distension. There is no tenderness.   Musculoskeletal: Normal range of motion.  Neurological: She is alert and oriented to person, place, and time.  Skin: Skin is warm and dry.  Psychiatric: She has a normal mood and affect. Her behavior is normal.    No results found for this or any previous visit (from the past 24 hour(s)).  No results found.  Assessment/Plan: 62ZH 25 G3P2012 s/p SVD for PPBTL D/w pt r/b/a of PPBTL - wishes to proceed  [name], Amanda Lindsey 05/08/2016, 6:34 PM

## 2016-05-09 ENCOUNTER — Ambulatory Visit (HOSPITAL_COMMUNITY): Payer: Medicaid Other | Admitting: Anesthesiology

## 2016-05-09 ENCOUNTER — Encounter (HOSPITAL_COMMUNITY): Payer: Self-pay

## 2016-05-09 ENCOUNTER — Encounter (HOSPITAL_COMMUNITY)
Admission: RE | Disposition: A | Discharge status: Home / Self Care | Payer: Self-pay | Source: Ambulatory Visit | Attending: Obstetrics and Gynecology

## 2016-05-09 ENCOUNTER — Ambulatory Visit (HOSPITAL_COMMUNITY)
Admission: RE | Admit: 2016-05-09 | Discharge: 2016-05-09 | Disposition: A | Discharge status: Home / Self Care | Payer: Medicaid Other | Source: Ambulatory Visit | Attending: Obstetrics and Gynecology | Admitting: Obstetrics and Gynecology

## 2016-05-09 DIAGNOSIS — Z302 Encounter for sterilization: Secondary | ICD-10-CM | POA: Diagnosis not present

## 2016-05-09 DIAGNOSIS — Z87891 Personal history of nicotine dependence: Secondary | ICD-10-CM | POA: Insufficient documentation

## 2016-05-09 HISTORY — PX: LAPAROSCOPIC TUBAL LIGATION: SHX1937

## 2016-05-09 LAB — PREGNANCY, URINE: PREG TEST UR: NEGATIVE

## 2016-05-09 SURGERY — LIGATION, FALLOPIAN TUBE, LAPAROSCOPIC
Anesthesia: General | Site: Abdomen | Laterality: Bilateral

## 2016-05-09 MED ORDER — GLYCOPYRROLATE 0.2 MG/ML IJ SOLN
INTRAMUSCULAR | Status: DC | PRN
  Administered 2016-05-09: 0.1 mg via INTRAVENOUS

## 2016-05-09 MED ORDER — BUPIVACAINE HCL (PF) 0.25 % IJ SOLN
INTRAMUSCULAR | Status: DC | PRN
  Administered 2016-05-09: 10 mL

## 2016-05-09 MED ORDER — KETOROLAC TROMETHAMINE 30 MG/ML IJ SOLN
INTRAMUSCULAR | Status: AC
  Filled 2016-05-09: qty 1

## 2016-05-09 MED ORDER — GLYCOPYRROLATE 0.2 MG/ML IJ SOLN
INTRAMUSCULAR | Status: AC
  Filled 2016-05-09: qty 1

## 2016-05-09 MED ORDER — OXYCODONE HCL 5 MG PO TABS
ORAL_TABLET | ORAL | Status: AC
  Filled 2016-05-09: qty 1

## 2016-05-09 MED ORDER — DEXAMETHASONE SODIUM PHOSPHATE 10 MG/ML IJ SOLN
INTRAMUSCULAR | Status: DC | PRN
  Administered 2016-05-09: 10 mg via INTRAVENOUS

## 2016-05-09 MED ORDER — METOCLOPRAMIDE HCL 5 MG/ML IJ SOLN: 10.0000 mg | Freq: Once | INTRAMUSCULAR | Status: DC | PRN

## 2016-05-09 MED ORDER — SODIUM CHLORIDE 0.9 % IJ SOLN
INTRAMUSCULAR | Status: DC | PRN
Start: 2016-05-09 — End: 2016-05-09
  Administered 2016-05-09: 10 mL

## 2016-05-09 MED ORDER — PROPOFOL 10 MG/ML IV BOLUS
INTRAVENOUS | Status: DC | PRN
  Administered 2016-05-09: 150 mg via INTRAVENOUS

## 2016-05-09 MED ORDER — MEPERIDINE HCL 25 MG/ML IJ SOLN: 6.2500 mg | INTRAMUSCULAR | Status: DC | PRN

## 2016-05-09 MED ORDER — KETOROLAC TROMETHAMINE 30 MG/ML IJ SOLN: 30.0000 mg | Freq: Once | INTRAMUSCULAR | Status: DC | PRN

## 2016-05-09 MED ORDER — LACTATED RINGERS IV SOLN
INTRAVENOUS | Status: DC
  Administered 2016-05-09: 125 mL/h via INTRAVENOUS

## 2016-05-09 MED ORDER — FENTANYL CITRATE (PF) 250 MCG/5ML IJ SOLN
INTRAMUSCULAR | Status: AC
  Filled 2016-05-09: qty 5

## 2016-05-09 MED ORDER — FENTANYL CITRATE (PF) 100 MCG/2ML IJ SOLN
INTRAMUSCULAR | Status: DC | PRN
  Administered 2016-05-09 (×5): 50 ug via INTRAVENOUS

## 2016-05-09 MED ORDER — ROCURONIUM BROMIDE 100 MG/10ML IV SOLN
INTRAVENOUS | Status: DC | PRN
  Administered 2016-05-09: 40 mg via INTRAVENOUS
  Administered 2016-05-09: 10 mg via INTRAVENOUS

## 2016-05-09 MED ORDER — LIDOCAINE HCL (CARDIAC) 20 MG/ML IV SOLN
INTRAVENOUS | Status: DC | PRN
  Administered 2016-05-09: 100 mg via INTRAVENOUS

## 2016-05-09 MED ORDER — SCOPOLAMINE 1 MG/3DAYS TD PT72
1.0000 | MEDICATED_PATCH | Freq: Once | TRANSDERMAL | Status: DC
  Administered 2016-05-09: 1.5 mg via TRANSDERMAL

## 2016-05-09 MED ORDER — SUGAMMADEX SODIUM 200 MG/2ML IV SOLN
INTRAVENOUS | Status: AC
  Filled 2016-05-09: qty 2

## 2016-05-09 MED ORDER — SUGAMMADEX SODIUM 200 MG/2ML IV SOLN
INTRAVENOUS | Status: DC | PRN
  Administered 2016-05-09: 200 mg via INTRAVENOUS

## 2016-05-09 MED ORDER — SODIUM CHLORIDE 0.9 % IJ SOLN
INTRAMUSCULAR | Status: AC
  Filled 2016-05-09: qty 10

## 2016-05-09 MED ORDER — OXYCODONE HCL 5 MG PO TABS
5.0000 mg | ORAL_TABLET | Freq: Once | ORAL | Status: AC | PRN
  Administered 2016-05-09: 5 mg via ORAL

## 2016-05-09 MED ORDER — OXYCODONE HCL 5 MG/5ML PO SOLN: 5.0000 mg | Freq: Once | ORAL | Status: AC | PRN

## 2016-05-09 MED ORDER — DEXAMETHASONE SODIUM PHOSPHATE 10 MG/ML IJ SOLN
INTRAMUSCULAR | Status: AC
  Filled 2016-05-09: qty 1

## 2016-05-09 MED ORDER — SCOPOLAMINE 1 MG/3DAYS TD PT72
MEDICATED_PATCH | TRANSDERMAL | Status: AC
  Administered 2016-05-09: 1.5 mg via TRANSDERMAL
  Filled 2016-05-09: qty 1

## 2016-05-09 MED ORDER — KETOROLAC TROMETHAMINE 30 MG/ML IJ SOLN
INTRAMUSCULAR | Status: DC | PRN
  Administered 2016-05-09: 30 mg via INTRAVENOUS

## 2016-05-09 MED ORDER — BUPIVACAINE HCL (PF) 0.25 % IJ SOLN
INTRAMUSCULAR | Status: AC
  Filled 2016-05-09: qty 30

## 2016-05-09 MED ORDER — HYDROMORPHONE HCL 1 MG/ML IJ SOLN: 0.2500 mg | INTRAMUSCULAR | Status: DC | PRN

## 2016-05-09 MED ORDER — MIDAZOLAM HCL 2 MG/2ML IJ SOLN
INTRAMUSCULAR | Status: DC | PRN
  Administered 2016-05-09: 2 mg via INTRAVENOUS

## 2016-05-09 MED ORDER — LACTATED RINGERS IV SOLN
INTRAVENOUS | Status: DC
  Administered 2016-05-09 (×2): via INTRAVENOUS

## 2016-05-09 MED ORDER — ONDANSETRON HCL 4 MG/2ML IJ SOLN
INTRAMUSCULAR | Status: AC
Start: 2016-05-09 — End: 2016-05-09
  Filled 2016-05-09: qty 2

## 2016-05-09 MED ORDER — OXYCODONE HCL 5 MG PO TABS: ORAL_TABLET | ORAL | 0 refills | Status: DC

## 2016-05-09 MED ORDER — ONDANSETRON HCL 4 MG/2ML IJ SOLN
INTRAMUSCULAR | Status: DC | PRN
  Administered 2016-05-09: 4 mg via INTRAVENOUS

## 2016-05-09 MED ORDER — MIDAZOLAM HCL 2 MG/2ML IJ SOLN
INTRAMUSCULAR | Status: AC
  Filled 2016-05-09: qty 2

## 2016-05-09 SURGICAL SUPPLY — 32 items
ADH SKN CLS APL DERMABOND .7 (GAUZE/BANDAGES/DRESSINGS) ×1
BAG SPEC RTRVL LRG 6X4 10 (ENDOMECHANICALS)
CATH ROBINSON RED A/P 16FR (CATHETERS) ×3 IMPLANT
CLOTH BEACON ORANGE TIMEOUT ST (SAFETY) ×3 IMPLANT
DECANTER SPIKE VIAL GLASS SM (MISCELLANEOUS) ×6 IMPLANT
DERMABOND ADVANCED (GAUZE/BANDAGES/DRESSINGS) ×2
DERMABOND ADVANCED .7 DNX12 (GAUZE/BANDAGES/DRESSINGS) ×1 IMPLANT
DRSG OPSITE POSTOP 3X4 (GAUZE/BANDAGES/DRESSINGS) ×2 IMPLANT
DURAPREP 26ML APPLICATOR (WOUND CARE) ×3 IMPLANT
GLOVE BIO SURGEON STRL SZ 6.5 (GLOVE) ×2 IMPLANT
GLOVE BIO SURGEONS STRL SZ 6.5 (GLOVE) ×1
GLOVE BIOGEL PI IND STRL 7.0 (GLOVE) ×1 IMPLANT
GLOVE BIOGEL PI INDICATOR 7.0 (GLOVE) ×2
GOWN STRL REUS W/TWL LRG LVL3 (GOWN DISPOSABLE) ×6 IMPLANT
NEEDLE INSUFFLATION 120MM (ENDOMECHANICALS) ×3 IMPLANT
PACK LAPAROSCOPY BASIN (CUSTOM PROCEDURE TRAY) ×3 IMPLANT
PACK TRENDGUARD 450 HYBRID PRO (MISCELLANEOUS) IMPLANT
PACK TRENDGUARD 600 HYBRD PROC (MISCELLANEOUS) IMPLANT
POUCH SPECIMEN RETRIEVAL 10MM (ENDOMECHANICALS) IMPLANT
PROTECTOR NERVE ULNAR (MISCELLANEOUS) ×6 IMPLANT
SHEARS HARMONIC ACE PLUS 36CM (ENDOMECHANICALS) ×1 IMPLANT
SLEEVE XCEL OPT CAN 5 100 (ENDOMECHANICALS) ×3 IMPLANT
SUT VIC AB 3-0 CTX 36 (SUTURE) IMPLANT
SUT VIC AB 3-0 PS2 18 (SUTURE) ×3
SUT VIC AB 3-0 PS2 18XBRD (SUTURE) ×1 IMPLANT
SUT VICRYL 0 UR6 27IN ABS (SUTURE) ×2 IMPLANT
TOWEL OR 17X24 6PK STRL BLUE (TOWEL DISPOSABLE) ×6 IMPLANT
TRENDGUARD 450 HYBRID PRO PACK (MISCELLANEOUS) ×3
TRENDGUARD 600 HYBRID PROC PK (MISCELLANEOUS)
TROCAR XCEL NON-BLD 11X100MML (ENDOMECHANICALS) ×3 IMPLANT
WARMER LAPAROSCOPE (MISCELLANEOUS) ×3 IMPLANT
WATER STERILE IRR 1000ML POUR (IV SOLUTION) ×3 IMPLANT

## 2016-05-09 NOTE — Anesthesia Preprocedure Evaluation (Signed)
Anesthesia Evaluation  Patient identified by MRN, date of birth, ID band Patient awake    Reviewed: Allergy & Precautions, NPO status , Patient's Chart, lab work & pertinent test results  Airway Mallampati: II  TM Distance: >3 FB Neck ROM: Full    Dental no notable dental hx.    Pulmonary neg pulmonary ROS, former smoker,    Pulmonary exam normal breath sounds clear to auscultation       Cardiovascular negative cardio ROS Normal cardiovascular exam Rhythm:Regular Rate:Normal     Neuro/Psych negative neurological ROS  negative psych ROS   GI/Hepatic negative GI ROS, Neg liver ROS,   Endo/Other  negative endocrine ROS  Renal/GU negative Renal ROS  negative genitourinary   Musculoskeletal negative musculoskeletal ROS (+)   Abdominal (+) + obese,   Peds negative pediatric ROS (+)  Hematology negative hematology ROS (+)   Anesthesia Other Findings   Reproductive/Obstetrics negative OB ROS                             Anesthesia Physical Anesthesia Plan  ASA: II  Anesthesia Plan: General   Post-op Pain Management:    Induction: Intravenous  Airway Management Planned: Oral ETT  Additional Equipment:   Intra-op Plan:   Post-operative Plan: Extubation in OR  Informed Consent: I have reviewed the patients History and Physical, chart, labs and discussed the procedure including the risks, benefits and alternatives for the proposed anesthesia with the patient or authorized representative who has indicated his/her understanding and acceptance.   Dental advisory given  Plan Discussed with: CRNA  Anesthesia Plan Comments:         Anesthesia Quick Evaluation

## 2016-05-09 NOTE — Anesthesia Postprocedure Evaluation (Signed)
Anesthesia Post Note  Patient: Amanda Lindsey  Procedure(s) Performed: Procedure(s) (LRB): LAPAROSCOPIC TUBAL LIGATION (Bilateral)  Patient location during evaluation: PACU Anesthesia Type: General Level of consciousness: awake and alert Pain management: pain level controlled Vital Signs Assessment: post-procedure vital signs reviewed and stable Respiratory status: spontaneous breathing, nonlabored ventilation and respiratory function stable Cardiovascular status: blood pressure returned to baseline and stable Postop Assessment: no signs of nausea or vomiting Anesthetic complications: no        Last Vitals:  Vitals:   05/09/16 1000 05/09/16 1015  BP: 124/76 133/79  Pulse: 72 81  Resp: 15 16  Temp:  36.8 C    Last Pain:  Vitals:   05/09/16 1015  TempSrc: Oral   Pain Goal: Patients Stated Pain Goal: 3 (05/09/16 0713)               Lowella CurbWarren Ray Shenika Quint

## 2016-05-09 NOTE — Brief Op Note (Signed)
05/09/2016  9:34 AM  PATIENT:  Amanda Lindsey  26 y.o. female  PRE-OPERATIVE DIAGNOSIS:  Desires sterilization  POST-OPERATIVE DIAGNOSIS:  Desires sterilization  PROCEDURE:  Procedure(s): LAPAROSCOPIC TUBAL LIGATION (Bilateral) by fulguration  SURGEON:  Surgeon(s) and Role:    * Sherian ReinJody [name], MD - Primary  ANESTHESIA:   general  EBL:  Total I/O In: 1300 [I.V.:1300] Out: 220 [Urine:200; Blood:20]  BLOOD ADMINISTERED:none  DRAINS: none   LOCAL MEDICATIONS USED:  MARCAINE     SPECIMEN:  No Specimen  DISPOSITION OF SPECIMEN:  N/A  COUNTS:  YES  TOURNIQUET:  * No tourniquets in log *  DICTATION: .Other Dictation: Dictation Number [id number]  PLAN OF CARE: Discharge to home after PACU  PATIENT DISPOSITION:  PACU - hemodynamically stable.   Delay start of Pharmacological VTE agent (>24hrs) due to surgical blood loss or risk of bleeding: not applicable

## 2016-05-09 NOTE — Anesthesia Procedure Notes (Signed)
Procedure Name: Intubation Date/Time: 05/09/2016 8:33 AM Performed by: Georgeanne Nim Pre-anesthesia Checklist: Patient identified, Emergency Drugs available, Suction available, Patient being monitored and Timeout performed Patient Re-evaluated:Patient Re-evaluated prior to inductionOxygen Delivery Method: Circle system utilized Preoxygenation: Pre-oxygenation with 100% oxygen Intubation Type: IV induction Ventilation: Mask ventilation without difficulty Laryngoscope Size: Mac and 3 Grade View: Grade I Tube type: Oral Tube size: 7.0 mm Number of attempts: 1 Airway Equipment and Method: Stylet Secured at: 21 cm Tube secured with: Tape Dental Injury: Teeth and Oropharynx as per pre-operative assessment

## 2016-05-09 NOTE — Interval H&P Note (Signed)
History and Physical Interval Note:  05/09/2016 8:12 AM  Amanda Lindsey  has presented today for surgery, with the diagnosis of sterilization  The various methods of treatment have been discussed with the patient and family. After consideration of risks, benefits and other options for treatment, the patient has consented to  Procedure(s): LAPAROSCOPIC TUBAL LIGATION (Bilateral) as a surgical intervention .  The patient's history has been reviewed, patient examined, no change in status, stable for surgery.  I have reviewed the patient's chart and labs.  Questions were answered to the patient's satisfaction.     [name], Joscelynn Brutus

## 2016-05-09 NOTE — Transfer of Care (Signed)
Immediate Anesthesia Transfer of Care Note  Patient: Amanda GeninCarrie M Freer  Procedure(s) Performed: Procedure(s): LAPAROSCOPIC TUBAL LIGATION (Bilateral)  Patient Location: PACU  Anesthesia Type:General  Level of Consciousness: awake, alert , oriented and patient cooperative  Airway & Oxygen Therapy: Patient Spontanous Breathing and Patient connected to nasal cannula oxygen  Post-op Assessment: Report given to RN and Post -op Vital signs reviewed and stable  Post vital signs: Reviewed and stable  Last Vitals:  Vitals:   05/09/16 0713  BP: 124/79  Pulse: 75  Resp: 16  Temp: 36.7 C    Last Pain:  Vitals:   05/09/16 0713  TempSrc: Oral      Patients Stated Pain Goal: 3 (05/09/16 0713)  Complications: No apparent anesthesia complications

## 2016-05-09 NOTE — Op Note (Signed)
NAME:  ,                                 ACCOUNT NO.:  MEDICAL RECORD NO.:  [id number]  LOCATION:                                 FACILITY:  PHYSICIAN:  Sherron MondayJody Bovard, MD             DATE OF BIRTH:  DATE OF PROCEDURE:  05/09/2016 DATE OF DISCHARGE:                              OPERATIVE REPORT   PREOPERATIVE DIAGNOSIS:  Status post spontaneous vaginal delivery on February 18 with immature Medicaid tubal papers, desires sterilization.  POSTOPERATIVE DIAGNOSIS:  Status post spontaneous vaginal delivery on February 18 with immature Medicaid tubal papers, desires sterilization, status post laparoscopic tubal ligation via fulguration.  PROCEDURE:  Laparoscopic tubal ligation via fulguration.  ANESTHESIA:  General.  IV FLUIDS:  1300.  URINE OUTPUT:  200 mL.  ESTIMATED BLOOD LOSS:  Approximately 20 mL.  PATHOLOGY:  None.  COMPLICATIONS:  None.  DESCRIPTION OF PROCEDURE:  After informed consent was reviewed with the patient and she was transferred to the operating room, placed on the table in supine position, placed in the [place] stirrups, double checking that she is comfortable.  General anesthesia was induced and found to be adequate.  She was then prepped and draped in the normal sterile fashion.  Using an open-sided speculum, a Hulka manipulator was placed in her uterus.  Her bladder was sterilely drained.  Gloves and gown were changed.  Attention was turned to the abdominal portion of the case and approximately 15 mm infraumbilical incision was made.  The fascia was cleared off with hemostats.  Using the Veress needle, the peritoneum was entered after confirmation with the hanging drop test. Pneumoperitoneum was obtained.  The trocar was placed with direct visualization.  A brief pelvic survey revealed normal postpartum uterus, normal tubes and ovaries bilaterally.  Normal liver edge and gallbladder.  The tubes were followed out to the fimbriated end bilaterally.  Using a  triple burn technique, an approximately 3 cm portion of the tubes was fulgurated using the Kleppinger device. Hemostasis was assured.  The gas was evacuated from the abdomen.  The trocar was removed after the gas was evacuated.  The suture of 0 Vicryl was used to reapproximate the fascia.  The skin was reapproximated using subcu stitch with 3-0 Vicryl and Dermabond was applied.  The patient tolerated the procedure well.  The Hulka manipulator was removed.  Sponge, lap, and needle counts were correct x2 per the operating staff.     Sherron MondayJody Bovard, MD     JB/MEDQ  D:  05/09/2016  T:  05/09/2016  Job:  [id number]

## 2016-05-09 NOTE — Discharge Instructions (Signed)

## 2016-05-11 ENCOUNTER — Encounter (HOSPITAL_COMMUNITY): Payer: Self-pay | Admitting: Obstetrics and Gynecology

## 2016-07-10 ENCOUNTER — Encounter (HOSPITAL_BASED_OUTPATIENT_CLINIC_OR_DEPARTMENT_OTHER): Payer: Self-pay | Admitting: Emergency Medicine

## 2016-07-10 ENCOUNTER — Emergency Department (HOSPITAL_BASED_OUTPATIENT_CLINIC_OR_DEPARTMENT_OTHER)
Admission: EM | Admit: 2016-07-10 | Discharge: 2016-07-10 | Disposition: A | Discharge status: Home / Self Care | Payer: Medicaid Other | Attending: Emergency Medicine | Admitting: Emergency Medicine

## 2016-07-10 DIAGNOSIS — Z87891 Personal history of nicotine dependence: Secondary | ICD-10-CM | POA: Insufficient documentation

## 2016-07-10 DIAGNOSIS — J0100 Acute maxillary sinusitis, unspecified: Secondary | ICD-10-CM

## 2016-07-10 MED ORDER — FLUTICASONE PROPIONATE 50 MCG/ACT NA SUSP: 2.0000 | Freq: Every day | NASAL | 0 refills | Status: DC

## 2016-07-10 MED ORDER — PSEUDOEPHEDRINE HCL 60 MG PO TABS: 60.0000 mg | ORAL_TABLET | ORAL | 0 refills | Status: DC | PRN

## 2016-07-10 MED ORDER — AMOXICILLIN-POT CLAVULANATE 875-125 MG PO TABS: 1.0000 | ORAL_TABLET | Freq: Two times a day (BID) | ORAL | 0 refills | Status: DC

## 2016-07-10 NOTE — ED Triage Notes (Signed)
Sinus pressure and congestion x 1 week.

## 2016-07-10 NOTE — Discharge Instructions (Signed)
Please read and follow all provided instructions.  Your diagnoses today include:  1. Acute non-recurrent maxillary sinusitis    Tests performed today include:  Vital signs. See below for your results today.   Medications prescribed:   Pseudoephedrine - decongestant medication to help with nasal congestion   Augmentin - antibiotic  You have been prescribed an antibiotic medicine: take the entire course of medicine even if you are feeling better. Stopping early can cause the antibiotic not to work.  Only fill this antibiotic if you worsen or continue to have symptoms in 72 hours. If filled, take entire course of antibiotics as directed and follow-up with your doctor.  Take any prescribed medications only as directed.  Home care instructions:  Follow any educational materials contained in this packet.  BE VERY CAREFUL not to take multiple medicines containing Tylenol (also called acetaminophen). Doing so can lead to an overdose which can damage your liver and cause liver failure and possibly death.   Follow-up instructions: Please follow-up with your primary care provider in the next 3 days for further evaluation of your symptoms.   Return instructions:   Please return to the Emergency Department if you experience worsening symptoms.   Please return if you have any other emergent concerns.  Additional Information:  Your vital signs today were: BP 124/80 (BP Location: Right Arm)    Pulse 92    Temp 98.5 F (36.9 C) (Oral)    Resp 12    Ht 5\' 5"  (1.651 m)    Wt 86.2 kg (190 lb)    LMP 06/13/2016    SpO2 100%    BMI 31.62 kg/m  If your blood pressure (BP) was elevated above 135/85 this visit, please have this repeated by your doctor within one month. --------------

## 2016-07-10 NOTE — ED Provider Notes (Signed)
[facility]   CSN: [id number] Arrival date & time: 07/10/16  1314     History   Chief Complaint Chief Complaint  Patient presents with  . Facial Pain    HPI Amanda Lindsey is a 26 y.o. female.  Patient presents with complaint of nasal congestion, facial pain, headache, runny nose and sneezing for the past 1 week. Patient notes that several coworkers have been sick. She denies any documented fevers, ear pain, sore throat, cough, chest pain. No vomiting or diarrhea. Patient has used Tylenol with improvement in her pain. No other over-the-counter medications used. Denies a strong history of seasonal allergies. The onset of this condition was acute. The course is constant. Aggravating factors: none.       Past Medical History:  Diagnosis Date  . SVD (spontaneous vaginal delivery) 08/13/2014  . SVD (spontaneous vaginal delivery) 04/01/2016    Patient Active Problem List   Diagnosis Date Noted  . Normal labor 04/01/2016  . SVD (spontaneous vaginal delivery) 04/01/2016    Past Surgical History:  Procedure Laterality Date  . LAPAROSCOPIC TUBAL LIGATION Bilateral 05/09/2016   Procedure: LAPAROSCOPIC TUBAL LIGATION;  Surgeon: Sherian Rein, MD;  Location: [facility];  Service: Gynecology;  Laterality: Bilateral;  . MOUTH SURGERY    . NO PAST SURGERIES      OB History    Gravida Para Term Preterm AB Living   3 2 2   1 2    SAB TAB Ectopic Multiple Live Births         0 2       Home Medications    Prior to Admission medications   Medication Sig Start Date End Date Taking? Authorizing Provider  amoxicillin-clavulanate (AUGMENTIN) 875-125 MG tablet Take 1 tablet by mouth every 12 (twelve) hours. 07/10/16   Renne Crigler, PA-C  fluticasone (FLONASE) 50 MCG/ACT nasal spray Place 2 sprays into both nostrils daily. 07/10/16   Renne Crigler, PA-C  oxyCODONE (OXY IR/ROXICODONE) 5 MG immediate release tablet 1-2 po q 6 hr prn severe pain 05/09/16    [name], Jody, MD  Prenatal Vit-Fe Fumarate-FA (PRENATAL MULTIVITAMIN) TABS tablet Take 1 tablet by mouth daily.    [provider]  pseudoephedrine (SUDAFED) 60 MG tablet Take 1 tablet (60 mg total) by mouth every 4 (four) hours as needed for congestion. 07/10/16   Renne Crigler, PA-C    Family History Family History  Problem Relation Age of Onset  . Polycythemia Paternal Grandfather     Social History Social History  Substance Use Topics  . Smoking status: Former Smoker    Quit date: 02/11/2014  . Smokeless tobacco: Never Used  . Alcohol use No     Allergies   Patient has no known allergies.   Review of Systems Review of Systems  Constitutional: Negative for chills, fatigue and fever.  HENT: Positive for congestion, rhinorrhea, sinus pain, sinus pressure and sneezing. Negative for ear pain and sore throat.   Eyes: Negative for discharge and redness.  Respiratory: Negative for cough and wheezing.   Gastrointestinal: Negative for abdominal pain, diarrhea, nausea and vomiting.  Genitourinary: Negative for dysuria.  Musculoskeletal: Negative for myalgias and neck stiffness.  Skin: Negative for rash.  Neurological: Positive for headaches.  Hematological: Negative for adenopathy.     Physical Exam Updated Vital Signs BP 124/80 (BP Location: Right Arm)   Pulse 92   Temp 98.5 F (36.9 C) (Oral)   Resp 12   Ht 5\' 5"  (1.651 m)  Wt 86.2 kg (190 lb)   LMP 06/13/2016   SpO2 100%   BMI 31.62 kg/m   Physical Exam  Constitutional: She appears well-developed and well-nourished.  HENT:  Head: Normocephalic and atraumatic.  Right Ear: Tympanic membrane, external ear and ear canal normal.  Left Ear: Tympanic membrane, external ear and ear canal normal.  Nose: Mucosal edema present. No rhinorrhea. Right sinus exhibits maxillary sinus tenderness and frontal sinus tenderness. Left sinus exhibits maxillary sinus tenderness. Left sinus exhibits no frontal sinus  tenderness.  Mouth/Throat: Uvula is midline, oropharynx is clear and moist and mucous membranes are normal. Mucous membranes are not dry. No oral lesions. No trismus in the jaw. No uvula swelling. No oropharyngeal exudate, posterior oropharyngeal edema, posterior oropharyngeal erythema or tonsillar abscesses.  Eyes: Conjunctivae are normal. Right eye exhibits no discharge. Left eye exhibits no discharge.  Neck: Normal range of motion. Neck supple.  Cardiovascular: Normal rate, regular rhythm and normal heart sounds.   Pulmonary/Chest: Effort normal and breath sounds normal. No respiratory distress. She has no wheezes. She has no rales.  Abdominal: Soft. There is no tenderness.  Lymphadenopathy:    She has no cervical adenopathy.  Neurological: She is alert.  Skin: Skin is warm and dry.  Psychiatric: She has a normal mood and affect.  Nursing note and vitals reviewed.    ED Treatments / Results   Procedures Procedures (including critical care time)  Medications Ordered in ED Medications - No data to display   Initial Impression / Assessment and Plan / ED Course  I have reviewed the triage vital signs and the nursing notes.  Pertinent labs & imaging results that were available during my care of the patient were reviewed by me and considered in my medical decision making (see chart for details).     Patient seen and examined.   Vital signs reviewed and are as follows: BP 124/80 (BP Location: Right Arm)   Pulse 92   Temp 98.5 F (36.9 C) (Oral)   Resp 12   Ht 5\' 5"  (1.651 m)   Wt 86.2 kg (190 lb)   LMP 06/13/2016   SpO2 100%   BMI 31.62 kg/m   Will treat patient symptomatically with Sudafed and Flonase. If patient is not improved in 3 days, she was given a prescription for Augmentin to fill.  Encourage return with high fever, vomiting, severe headache, new symptoms or other concerns.  Final Clinical Impressions(s) / ED Diagnoses   Final diagnoses:  Acute  non-recurrent maxillary sinusitis   Patient with clinical sinusitis for 7 days. She appears well, nontoxic. Will continue symptomatic treatment as she has not maximized previous therapy. Antibiotic if not improved in 72 hours.  New Prescriptions New Prescriptions   AMOXICILLIN-CLAVULANATE (AUGMENTIN) 875-125 MG TABLET    Take 1 tablet by mouth every 12 (twelve) hours.   FLUTICASONE (FLONASE) 50 MCG/ACT NASAL SPRAY    Place 2 sprays into both nostrils daily.   PSEUDOEPHEDRINE (SUDAFED) 60 MG TABLET    Take 1 tablet (60 mg total) by mouth every 4 (four) hours as needed for congestion.     Renne CriglerGeiple, Minervia Osso, PA-C 07/10/16 1616    Charlynne PanderYao, David Hsienta, MD 07/11/16 [phone]

## 2017-08-01 ENCOUNTER — Encounter (HOSPITAL_COMMUNITY): Payer: Self-pay

## 2017-08-01 ENCOUNTER — Ambulatory Visit (HOSPITAL_COMMUNITY)
Admission: EM | Admit: 2017-08-01 | Discharge: 2017-08-01 | Disposition: A | Discharge status: Home / Self Care | Payer: Medicaid Other | Attending: Family Medicine | Admitting: Family Medicine

## 2017-08-01 DIAGNOSIS — H9203 Otalgia, bilateral: Secondary | ICD-10-CM

## 2017-08-01 DIAGNOSIS — J069 Acute upper respiratory infection, unspecified: Secondary | ICD-10-CM | POA: Diagnosis not present

## 2017-08-01 MED ORDER — IBUPROFEN 600 MG PO TABS: 600.0000 mg | ORAL_TABLET | Freq: Four times a day (QID) | ORAL | 0 refills | Status: AC | PRN

## 2017-08-01 MED ORDER — TRIAMCINOLONE ACETONIDE 55 MCG/ACT NA AERO: 2.0000 | INHALATION_SPRAY | Freq: Every day | NASAL | 12 refills | Status: DC

## 2017-08-01 NOTE — ED Triage Notes (Signed)
Pt presents with sore throat and bilateral earaches.

## 2017-08-01 NOTE — Discharge Instructions (Signed)
Push fluids to ensure adequate hydration and keep secretions thin.  Tylenol and/or ibuprofen as needed for pain or fevers.  Daily nasal spray should help with your ear pain. Rest. If symptoms worsen or do not improve in the next week to return to be seen or to follow up with your PCP.

## 2017-08-01 NOTE — ED Provider Notes (Signed)
[facility]    CSN: [id number] Arrival date & time: 08/01/17  1847     History   Chief Complaint Chief Complaint  Patient presents with  . Sore Throat  . Otalgia    HPI TREZURE CRONK is a 27 y.o. female.   Shondell presents with complaints of sore throat and bilateral ear pain. Sore throat started two days ago, followed by left ear pain. Right ear started today. Has been taking benadryl which has helped some. No fevers. No cough or congestion. Mild nausea but has been eating and drinking. No known ill contacts. Has been taking tylenol and ibuprofen for pain control, ibuprofen last 3 hours ago, has been helping. Without contributing medical history.     ROS per HPI.      Past Medical History:  Diagnosis Date  . SVD (spontaneous vaginal delivery) 08/13/2014  . SVD (spontaneous vaginal delivery) 04/01/2016    Patient Active Problem List   Diagnosis Date Noted  . Normal labor 04/01/2016  . SVD (spontaneous vaginal delivery) 04/01/2016    Past Surgical History:  Procedure Laterality Date  . LAPAROSCOPIC TUBAL LIGATION Bilateral 05/09/2016   Procedure: LAPAROSCOPIC TUBAL LIGATION;  Surgeon: Sherian Rein, MD;  Location: [facility];  Service: Gynecology;  Laterality: Bilateral;  . MOUTH SURGERY    . NO PAST SURGERIES      OB History    Gravida  3   Para  2   Term  2   Preterm      AB  1   Living  2     SAB      TAB      Ectopic      Multiple  0   Live Births  2            Home Medications    Prior to Admission medications   Medication Sig Start Date End Date Taking? Authorizing Provider  amoxicillin-clavulanate (AUGMENTIN) 875-125 MG tablet Take 1 tablet by mouth every 12 (twelve) hours. 07/10/16   Renne Crigler, PA-C  fluticasone (FLONASE) 50 MCG/ACT nasal spray Place 2 sprays into both nostrils daily. 07/10/16   Renne Crigler, PA-C  ibuprofen (ADVIL,MOTRIN) 600 MG tablet Take 1 tablet (600 mg total) by mouth every 6 (six) hours  as needed. 08/01/17   Linus Mako B, NP  oxyCODONE (OXY IR/ROXICODONE) 5 MG immediate release tablet 1-2 po q 6 hr prn severe pain 05/09/16   [name], Jody, MD  Prenatal Vit-Fe Fumarate-FA (PRENATAL MULTIVITAMIN) TABS tablet Take 1 tablet by mouth daily.    [provider]  pseudoephedrine (SUDAFED) 60 MG tablet Take 1 tablet (60 mg total) by mouth every 4 (four) hours as needed for congestion. 07/10/16   Renne Crigler, PA-C  triamcinolone (NASACORT) 55 MCG/ACT AERO nasal inhaler Place 2 sprays into the nose daily. 08/01/17   Georgetta Haber, NP    Family History Family History  Problem Relation Age of Onset  . Polycythemia Paternal Grandfather     Social History Social History   Tobacco Use  . Smoking status: Former Smoker    Last attempt to quit: 02/11/2014    Years since quitting: 3.4  . Smokeless tobacco: Never Used  Substance Use Topics  . Alcohol use: No  . Drug use: No     Allergies   Patient has no known allergies.   Review of Systems Review of Systems   Physical Exam Triage Vital Signs ED Triage Vitals [08/01/17 1916]  Enc Vitals Group  BP (!) 153/93     Pulse Rate 80     Resp 18     Temp 98.2 F (36.8 C)     Temp src      SpO2 100 %     Weight      Height      Head Circumference      Peak Flow      Pain Score 2     Pain Loc      Pain Edu?      Excl. in GC?    No data found.  Updated Vital Signs BP (!) 153/93   Pulse 80   Temp 98.2 F (36.8 C)   Resp 18   LMP 07/08/2017   SpO2 100%   Visual Acuity Right Eye Distance:   Left Eye Distance:   Bilateral Distance:    Right Eye Near:   Left Eye Near:    Bilateral Near:     Physical Exam  Constitutional: She is oriented to person, place, and time. She appears well-developed and well-nourished. No distress.  HENT:  Head: Normocephalic and atraumatic.  Right Ear: Tympanic membrane, external ear and ear canal normal.  Left Ear: External ear and ear canal normal. A  middle ear effusion is present.  Nose: Nose normal.  Mouth/Throat: Uvula is midline, oropharynx is clear and moist and mucous membranes are normal. No tonsillar exudate.  Eyes: Pupils are equal, round, and reactive to light. Conjunctivae and EOM are normal.  Cardiovascular: Normal rate, regular rhythm and normal heart sounds.  Pulmonary/Chest: Effort normal and breath sounds normal.  Neurological: She is alert and oriented to person, place, and time.  Skin: Skin is warm and dry.     UC Treatments / Results  Labs (all labs ordered are listed, but only abnormal results are displayed) Labs Reviewed - No data to display  EKG None  Radiology No results found.  Procedures Procedures (including critical care time)  Medications Ordered in UC Medications - No data to display  Initial Impression / Assessment and Plan / UC Course  I have reviewed the triage vital signs and the nursing notes.  Pertinent labs & imaging results that were available during my care of the patient were reviewed by me and considered in my medical decision making (see chart for details).     Non toxic in appearance. Benign physical findings. Continue with supportive cares for likely viral illness. Daily nasal spray to help with ear pain. Push fluids. Return precautions provided. Patient verbalized understanding and agreeable to plan.    Final Clinical Impressions(s) / UC Diagnoses   Final diagnoses:  Viral upper respiratory tract infection  Otalgia of both ears     Discharge Instructions     Push fluids to ensure adequate hydration and keep secretions thin.  Tylenol and/or ibuprofen as needed for pain or fevers.  Daily nasal spray should help with your ear pain. Rest. If symptoms worsen or do not improve in the next week to return to be seen or to follow up with your PCP.     ED Prescriptions    Medication Sig Dispense Auth. Provider   triamcinolone (NASACORT) 55 MCG/ACT AERO nasal inhaler Place 2  sprays into the nose daily. 1 Inhaler Linus Mako B, NP   ibuprofen (ADVIL,MOTRIN) 600 MG tablet Take 1 tablet (600 mg total) by mouth every 6 (six) hours as needed. 30 tablet Georgetta Haber, NP     Controlled Substance Prescriptions [place] Controlled Substance Registry  consulted? Not Applicable   Georgetta HaberBurky, Trystin Terhune B, NP 08/01/17 1932

## 2017-09-11 ENCOUNTER — Encounter (HOSPITAL_COMMUNITY): Payer: Self-pay | Admitting: Emergency Medicine

## 2017-09-11 ENCOUNTER — Ambulatory Visit (HOSPITAL_COMMUNITY)
Admission: EM | Admit: 2017-09-11 | Discharge: 2017-09-11 | Disposition: A | Discharge status: Home / Self Care | Payer: Medicaid Other | Attending: Family Medicine | Admitting: Family Medicine

## 2017-09-11 ENCOUNTER — Other Ambulatory Visit: Payer: Self-pay

## 2017-09-11 DIAGNOSIS — R112 Nausea with vomiting, unspecified: Secondary | ICD-10-CM | POA: Diagnosis not present

## 2017-09-11 DIAGNOSIS — R197 Diarrhea, unspecified: Secondary | ICD-10-CM

## 2017-09-11 DIAGNOSIS — Z3202 Encounter for pregnancy test, result negative: Secondary | ICD-10-CM | POA: Diagnosis not present

## 2017-09-11 LAB — POCT PREGNANCY, URINE: Preg Test, Ur: NEGATIVE

## 2017-09-11 MED ORDER — ONDANSETRON 4 MG PO TBDP: 4.0000 mg | ORAL_TABLET | Freq: Three times a day (TID) | ORAL | 0 refills | Status: AC | PRN

## 2017-09-11 MED ORDER — DICYCLOMINE HCL 20 MG PO TABS: 20.0000 mg | ORAL_TABLET | Freq: Two times a day (BID) | ORAL | 0 refills | Status: AC

## 2017-09-11 NOTE — ED Triage Notes (Signed)
Pt reports N,V, & D x2 days.  She also reports chills.  Pt has an appointment to get a tooth pulled next Friday with some pain associated.  She also complains of a headache and abdominal pain.

## 2017-09-11 NOTE — ED Notes (Signed)
Bed: UC01 Expected date: 09/11/17 Expected time:  Means of arrival:  Comments: For Appointment

## 2017-09-11 NOTE — ED Provider Notes (Signed)
[facility]    CSN: [id number] Arrival date & time: 09/11/17  1056     History   Chief Complaint Chief Complaint  Patient presents with  . Nausea  . Emesis    HPI Amanda ConferCarrie M Noguchi is a 27 y.o. female.   27 year old female comes in for 2-day history of nausea, vomiting, and 4 to 5-day history of diarrhea.  States has had about 4 episodes of nonbilious vomiting.  Had one episode of red vomiting after drinking cherry wine.  Has had frequent watery diarrhea.  States low abdominal pain that is described as "tightness" that is worse with sitting up, and resolved with laying down.  Has had chills without known fever.  Denies URI symptoms such as cough, congestion, sore throat.  Denies recent travels, recent antibiotic use.  Denies urinary symptoms such as a frequency, dysuria, hematuria.     Past Medical History:  Diagnosis Date  . SVD (spontaneous vaginal delivery) 08/13/2014  . SVD (spontaneous vaginal delivery) 04/01/2016    Patient Active Problem List   Diagnosis Date Noted  . Normal labor 04/01/2016  . SVD (spontaneous vaginal delivery) 04/01/2016    Past Surgical History:  Procedure Laterality Date  . LAPAROSCOPIC TUBAL LIGATION Bilateral 05/09/2016   Procedure: LAPAROSCOPIC TUBAL LIGATION;  Surgeon: Sherian ReinJody [name], MD;  Location: [facility];  Service: Gynecology;  Laterality: Bilateral;  . MOUTH SURGERY    . NO PAST SURGERIES      OB History    Gravida  3   Para  2   Term  2   Preterm      AB  1   Living  2     SAB      TAB      Ectopic      Multiple  0   Live Births  2            Home Medications    Prior to Admission medications   Medication Sig Start Date End Date Taking? Authorizing Provider  ibuprofen (ADVIL,MOTRIN) 600 MG tablet Take 1 tablet (600 mg total) by mouth every 6 (six) hours as needed. 08/01/17  Yes Burky, Barron AlvineNatalie B, NP  dicyclomine (BENTYL) 20 MG tablet Take 1 tablet (20 mg total) by mouth 2 (two) times daily.  09/11/17   Cathie HoopsYu, Amy V, PA-C  ondansetron (ZOFRAN ODT) 4 MG disintegrating tablet Take 1 tablet (4 mg total) by mouth every 8 (eight) hours as needed for nausea or vomiting. 09/11/17   Belinda FisherYu, Amy V, PA-C    Family History Family History  Problem Relation Age of Onset  . Polycythemia Paternal Grandfather     Social History Social History   Tobacco Use  . Smoking status: Former Smoker    Last attempt to quit: 02/11/2014    Years since quitting: 3.5  . Smokeless tobacco: Never Used  Substance Use Topics  . Alcohol use: No  . Drug use: No     Allergies   Patient has no known allergies.   Review of Systems Review of Systems  Reason unable to perform ROS: See HPI as above.     Physical Exam Triage Vital Signs ED Triage Vitals  Enc Vitals Group     BP 09/11/17 1112 138/88     Pulse Rate 09/11/17 1112 93     Resp --      Temp 09/11/17 1112 99 F (37.2 C)     Temp Source 09/11/17 1112 Oral     SpO2 09/11/17  1112 99 %     Weight --      Height --      Head Circumference --      Peak Flow --      Pain Score 09/11/17 1110 3     Pain Loc --      Pain Edu? --      Excl. in GC? --    No data found.  Updated Vital Signs BP 138/88 (BP Location: Right Arm)   Pulse 93   Temp 99 F (37.2 C) (Oral)   LMP 09/08/2017 (Exact Date)   SpO2 99%   Physical Exam  Constitutional: She is oriented to person, place, and time. She appears well-developed and well-nourished. No distress.  HENT:  Head: Normocephalic and atraumatic.  Eyes: Pupils are equal, round, and reactive to light. Conjunctivae are normal.  Cardiovascular: Normal rate, regular rhythm and normal heart sounds. Exam reveals no gallop and no friction rub.  No murmur heard. Pulmonary/Chest: Effort normal and breath sounds normal. She has no wheezes. She has no rales.  Abdominal: Soft. Bowel sounds are normal. She exhibits no mass. There is no tenderness. There is no rebound, no guarding and no CVA tenderness.    Neurological: She is alert and oriented to person, place, and time.  Skin: Skin is warm and dry.  Psychiatric: She has a normal mood and affect. Her behavior is normal. Judgment normal.     UC Treatments / Results  Labs (all labs ordered are listed, but only abnormal results are displayed) Labs Reviewed  POCT PREGNANCY, URINE    EKG None  Radiology No results found.  Procedures Procedures (including critical care time)  Medications Ordered in UC Medications - No data to display  Initial Impression / Assessment and Plan / UC Course  I have reviewed the triage vital signs and the nursing notes.  Pertinent labs & imaging results that were available during my care of the patient were reviewed by me and considered in my medical decision making (see chart for details).    Discussed with patient no alarming signs on exam. Zofran for nausea. Bentyl for abdominal tightness/cramping. Push fluids. Bland diet, advance as tolerated. Return precautions given.  Final Clinical Impressions(s) / UC Diagnoses   Final diagnoses:  Nausea vomiting and diarrhea    ED Prescriptions    Medication Sig Dispense Auth. Provider   ondansetron (ZOFRAN ODT) 4 MG disintegrating tablet Take 1 tablet (4 mg total) by mouth every 8 (eight) hours as needed for nausea or vomiting. 20 tablet Yu, Amy V, PA-C   dicyclomine (BENTYL) 20 MG tablet Take 1 tablet (20 mg total) by mouth 2 (two) times daily. 20 tablet Threasa Alpha, [place] 09/11/17 1257

## 2017-09-11 NOTE — Discharge Instructions (Signed)
Zofran for nausea and vomiting as needed. Bentyl for stomach cramping/tightness. Keep hydrated, you urine should be clear to pale yellow in color. Bland diet, advance as tolerated. Monitor for any worsening of symptoms, nausea or vomiting not controlled by medication, worsening abdominal pain, fever, blood in stool, blood in vomit, go to the emergency department for further evaluation needed.
